# Patient Record
Sex: Female | Born: 1990
Health system: Southern US, Community
[De-identification: ages and names within clinical notes are randomized; demographics above are authoritative.]

## PROBLEM LIST (undated history)

## (undated) DIAGNOSIS — F419 Anxiety disorder, unspecified: Secondary | ICD-10-CM

## (undated) DIAGNOSIS — F4310 Post-traumatic stress disorder, unspecified: Secondary | ICD-10-CM

## (undated) DIAGNOSIS — F988 Other specified behavioral and emotional disorders with onset usually occurring in childhood and adolescence: Secondary | ICD-10-CM

## (undated) DIAGNOSIS — K529 Noninfective gastroenteritis and colitis, unspecified: Secondary | ICD-10-CM

## (undated) DIAGNOSIS — F32A Depression, unspecified: Secondary | ICD-10-CM

## (undated) DIAGNOSIS — I319 Disease of pericardium, unspecified: Secondary | ICD-10-CM

## (undated) DIAGNOSIS — F329 Major depressive disorder, single episode, unspecified: Secondary | ICD-10-CM

## (undated) DIAGNOSIS — J45909 Unspecified asthma, uncomplicated: Secondary | ICD-10-CM

## (undated) DIAGNOSIS — F319 Bipolar disorder, unspecified: Secondary | ICD-10-CM

## (undated) DIAGNOSIS — K859 Acute pancreatitis without necrosis or infection, unspecified: Secondary | ICD-10-CM

## (undated) DIAGNOSIS — Z1589 Genetic susceptibility to other disease: Secondary | ICD-10-CM

## (undated) DIAGNOSIS — I514 Myocarditis, unspecified: Secondary | ICD-10-CM

## (undated) HISTORY — DX: Post-traumatic stress disorder, unspecified: F43.10

## (undated) HISTORY — DX: Disease of pericardium, unspecified: I31.9

## (undated) HISTORY — DX: Bipolar disorder, unspecified: F31.9

## (undated) HISTORY — DX: Genetic susceptibility to other disease: Z15.89

## (undated) HISTORY — PX: INTRAUTERINE DEVICE INSERTION: SHX323

## (undated) HISTORY — DX: Unspecified asthma, uncomplicated: J45.909

## (undated) HISTORY — PX: WISDOM TOOTH EXTRACTION: SHX21

## (undated) HISTORY — PX: IUD REMOVAL: SHX5392

## (undated) HISTORY — DX: Other specified behavioral and emotional disorders with onset usually occurring in childhood and adolescence: F98.8

## (undated) HISTORY — DX: Myocarditis, unspecified: I51.4

---
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
## 2016-03-24 DIAGNOSIS — Z1589 Genetic susceptibility to other disease: Secondary | ICD-10-CM | POA: Insufficient documentation

## 2016-07-15 DIAGNOSIS — F4310 Post-traumatic stress disorder, unspecified: Secondary | ICD-10-CM | POA: Insufficient documentation

## 2016-08-05 DIAGNOSIS — F3170 Bipolar disorder, currently in remission, most recent episode unspecified: Secondary | ICD-10-CM | POA: Insufficient documentation

## 2019-05-12 ENCOUNTER — Encounter (HOSPITAL_COMMUNITY): Payer: Self-pay | Admitting: Emergency Medicine

## 2019-05-12 ENCOUNTER — Other Ambulatory Visit: Payer: Self-pay

## 2019-05-12 ENCOUNTER — Inpatient Hospital Stay (HOSPITAL_COMMUNITY)
Admission: EM | Admit: 2019-05-12 | Discharge: 2019-05-14 | DRG: 392 | Disposition: A | Discharge status: Home / Self Care | Payer: Self-pay | Attending: Student in an Organized Health Care Education/Training Program | Admitting: Student in an Organized Health Care Education/Training Program

## 2019-05-12 DIAGNOSIS — F17210 Nicotine dependence, cigarettes, uncomplicated: Secondary | ICD-10-CM | POA: Diagnosis present

## 2019-05-12 DIAGNOSIS — K920 Hematemesis: Secondary | ICD-10-CM

## 2019-05-12 DIAGNOSIS — K2970 Gastritis, unspecified, without bleeding: Secondary | ICD-10-CM | POA: Diagnosis present

## 2019-05-12 DIAGNOSIS — K2900 Acute gastritis without bleeding: Secondary | ICD-10-CM

## 2019-05-12 DIAGNOSIS — F329 Major depressive disorder, single episode, unspecified: Secondary | ICD-10-CM | POA: Diagnosis present

## 2019-05-12 DIAGNOSIS — E860 Dehydration: Secondary | ICD-10-CM | POA: Diagnosis present

## 2019-05-12 DIAGNOSIS — E872 Acidosis, unspecified: Secondary | ICD-10-CM | POA: Diagnosis present

## 2019-05-12 DIAGNOSIS — F1010 Alcohol abuse, uncomplicated: Secondary | ICD-10-CM | POA: Diagnosis present

## 2019-05-12 DIAGNOSIS — Z888 Allergy status to other drugs, medicaments and biological substances status: Secondary | ICD-10-CM

## 2019-05-12 DIAGNOSIS — Z23 Encounter for immunization: Secondary | ICD-10-CM

## 2019-05-12 DIAGNOSIS — F419 Anxiety disorder, unspecified: Secondary | ICD-10-CM | POA: Diagnosis present

## 2019-05-12 DIAGNOSIS — Z20828 Contact with and (suspected) exposure to other viral communicable diseases: Secondary | ICD-10-CM | POA: Diagnosis present

## 2019-05-12 DIAGNOSIS — Z803 Family history of malignant neoplasm of breast: Secondary | ICD-10-CM

## 2019-05-12 DIAGNOSIS — E8729 Other acidosis: Secondary | ICD-10-CM

## 2019-05-12 DIAGNOSIS — K2920 Alcoholic gastritis without bleeding: Principal | ICD-10-CM | POA: Diagnosis present

## 2019-05-12 DIAGNOSIS — Z882 Allergy status to sulfonamides status: Secondary | ICD-10-CM

## 2019-05-12 DIAGNOSIS — R109 Unspecified abdominal pain: Secondary | ICD-10-CM | POA: Diagnosis present

## 2019-05-12 DIAGNOSIS — Z880 Allergy status to penicillin: Secondary | ICD-10-CM

## 2019-05-12 DIAGNOSIS — R1084 Generalized abdominal pain: Secondary | ICD-10-CM

## 2019-05-12 DIAGNOSIS — K529 Noninfective gastroenteritis and colitis, unspecified: Secondary | ICD-10-CM

## 2019-05-12 HISTORY — DX: Anxiety disorder, unspecified: F41.9

## 2019-05-12 HISTORY — DX: Depression, unspecified: F32.A

## 2019-05-12 HISTORY — DX: Acute pancreatitis without necrosis or infection, unspecified: K85.90

## 2019-05-12 HISTORY — DX: Noninfective gastroenteritis and colitis, unspecified: K52.9

## 2019-05-12 LAB — COMPREHENSIVE METABOLIC PANEL
ALT: 26 U/L (ref 0–44)
AST: 36 U/L (ref 15–41)
Albumin: 4.4 g/dL (ref 3.5–5.0)
Alkaline Phosphatase: 50 U/L (ref 38–126)
Anion gap: 20 — ABNORMAL HIGH (ref 5–15)
BUN: 11 mg/dL (ref 6–20)
CO2: 15 mmol/L — ABNORMAL LOW (ref 22–32)
Calcium: 9.9 mg/dL (ref 8.9–10.3)
Chloride: 101 mmol/L (ref 98–111)
Creatinine, Ser: 0.91 mg/dL (ref 0.44–1.00)
GFR calc Af Amer: >60 mL/min (ref >60)
GFR calc non Af Amer: >60 mL/min (ref >60)
Glucose, Bld: 109 mg/dL — ABNORMAL HIGH (ref 70–99)
Potassium: 3.5 mmol/L (ref 3.5–5.1)
Sodium: 136 mmol/L (ref 135–145)
Total Bilirubin: 1.7 mg/dL — ABNORMAL HIGH (ref 0.3–1.2)
Total Protein: 7.2 g/dL (ref 6.5–8.1)

## 2019-05-12 LAB — CBC WITH DIFFERENTIAL/PLATELET
Abs Immature Granulocytes: 0.03 10*3/uL (ref 0.00–0.07)
Basophils Absolute: 0.1 10*3/uL (ref 0.0–0.1)
Basophils Relative: 1 %
Eosinophils Absolute: 0 10*3/uL (ref 0.0–0.5)
Eosinophils Relative: 0 %
HCT: 41.2 % (ref 36.0–46.0)
Hemoglobin: 15.1 g/dL — ABNORMAL HIGH (ref 12.0–15.0)
Immature Granulocytes: 0 %
Lymphocytes Relative: 17 %
Lymphs Abs: 2 10*3/uL (ref 0.7–4.0)
MCH: 35.2 pg — ABNORMAL HIGH (ref 26.0–34.0)
MCHC: 36.7 g/dL — ABNORMAL HIGH (ref 30.0–36.0)
MCV: 96 fL (ref 80.0–100.0)
Monocytes Absolute: 1 10*3/uL (ref 0.1–1.0)
Monocytes Relative: 9 %
Neutro Abs: 8.3 10*3/uL — ABNORMAL HIGH (ref 1.7–7.7)
Neutrophils Relative %: 73 %
Platelets: 299 10*3/uL (ref 150–400)
RBC: 4.29 MIL/uL (ref 3.87–5.11)
RDW: 10.9 % — ABNORMAL LOW (ref 11.5–15.5)
WBC: 11.4 10*3/uL — ABNORMAL HIGH (ref 4.0–10.5)
nRBC: 0 % (ref 0.0–0.2)

## 2019-05-12 LAB — LIPASE, BLOOD: Lipase: 22 U/L (ref 11–51)

## 2019-05-12 LAB — I-STAT BETA HCG BLOOD, ED (MC, WL, AP ONLY): I-stat hCG, quantitative: <5 m[IU]/mL (ref <5)

## 2019-05-12 MED ORDER — ONDANSETRON 4 MG PO TBDP
4.0000 mg | ORAL_TABLET | Freq: Once | ORAL | Status: AC
  Administered 2019-05-12: 4 mg via ORAL
  Filled 2019-05-12: qty 1

## 2019-05-12 NOTE — ED Triage Notes (Signed)
Patient reports pancreatitis flare up onset this week with persistent emesis and diarrhea , no fever or chills .

## 2019-05-12 NOTE — ED Triage Notes (Addendum)
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

## 2019-05-13 ENCOUNTER — Emergency Department (HOSPITAL_COMMUNITY): Payer: Self-pay

## 2019-05-13 ENCOUNTER — Encounter (HOSPITAL_COMMUNITY): Payer: Self-pay | Admitting: Radiology

## 2019-05-13 DIAGNOSIS — K2970 Gastritis, unspecified, without bleeding: Secondary | ICD-10-CM | POA: Diagnosis present

## 2019-05-13 DIAGNOSIS — Z8719 Personal history of other diseases of the digestive system: Secondary | ICD-10-CM

## 2019-05-13 DIAGNOSIS — K2920 Alcoholic gastritis without bleeding: Principal | ICD-10-CM

## 2019-05-13 DIAGNOSIS — E872 Acidosis, unspecified: Secondary | ICD-10-CM | POA: Diagnosis present

## 2019-05-13 DIAGNOSIS — E860 Dehydration: Secondary | ICD-10-CM | POA: Diagnosis present

## 2019-05-13 DIAGNOSIS — Z720 Tobacco use: Secondary | ICD-10-CM

## 2019-05-13 DIAGNOSIS — K529 Noninfective gastroenteritis and colitis, unspecified: Secondary | ICD-10-CM

## 2019-05-13 DIAGNOSIS — R109 Unspecified abdominal pain: Secondary | ICD-10-CM | POA: Diagnosis present

## 2019-05-13 DIAGNOSIS — Z7289 Other problems related to lifestyle: Secondary | ICD-10-CM

## 2019-05-13 DIAGNOSIS — F319 Bipolar disorder, unspecified: Secondary | ICD-10-CM

## 2019-05-13 LAB — COMPREHENSIVE METABOLIC PANEL
ALT: 22 U/L (ref 0–44)
AST: 27 U/L (ref 15–41)
Albumin: 3.6 g/dL (ref 3.5–5.0)
Alkaline Phosphatase: 42 U/L (ref 38–126)
Anion gap: 7 (ref 5–15)
BUN: 5 mg/dL — ABNORMAL LOW (ref 6–20)
CO2: 18 mmol/L — ABNORMAL LOW (ref 22–32)
Calcium: 8.1 mg/dL — ABNORMAL LOW (ref 8.9–10.3)
Chloride: 110 mmol/L (ref 98–111)
Creatinine, Ser: 0.71 mg/dL (ref 0.44–1.00)
GFR calc Af Amer: >60 mL/min (ref >60)
GFR calc non Af Amer: >60 mL/min (ref >60)
Glucose, Bld: 105 mg/dL — ABNORMAL HIGH (ref 70–99)
Potassium: 2.9 mmol/L — ABNORMAL LOW (ref 3.5–5.1)
Sodium: 135 mmol/L (ref 135–145)
Total Bilirubin: 1.2 mg/dL (ref 0.3–1.2)
Total Protein: 5.8 g/dL — ABNORMAL LOW (ref 6.5–8.1)

## 2019-05-13 LAB — URINALYSIS, ROUTINE W REFLEX MICROSCOPIC
Bilirubin Urine: NEGATIVE
Glucose, UA: NEGATIVE mg/dL
Ketones, ur: 80 mg/dL — AB
Leukocytes,Ua: NEGATIVE
Nitrite: NEGATIVE
Protein, ur: 30 mg/dL — AB
Specific Gravity, Urine: 1.023 (ref 1.005–1.030)
pH: 6 (ref 5.0–8.0)

## 2019-05-13 LAB — CBC
HCT: 35 % — ABNORMAL LOW (ref 36.0–46.0)
Hemoglobin: 12.2 g/dL (ref 12.0–15.0)
MCH: 35.1 pg — ABNORMAL HIGH (ref 26.0–34.0)
MCHC: 34.9 g/dL (ref 30.0–36.0)
MCV: 100.6 fL — ABNORMAL HIGH (ref 80.0–100.0)
Platelets: 210 10*3/uL (ref 150–400)
RBC: 3.48 MIL/uL — ABNORMAL LOW (ref 3.87–5.11)
RDW: 11.3 % — ABNORMAL LOW (ref 11.5–15.5)
WBC: 8.5 10*3/uL (ref 4.0–10.5)
nRBC: 0 % (ref 0.0–0.2)

## 2019-05-13 LAB — HIV ANTIBODY (ROUTINE TESTING W REFLEX): HIV Screen 4th Generation wRfx: NONREACTIVE

## 2019-05-13 LAB — TROPONIN I (HIGH SENSITIVITY): Troponin I (High Sensitivity): <2 ng/L (ref <18)

## 2019-05-13 LAB — ETHANOL: Alcohol, Ethyl (B): <10 mg/dL (ref <10)

## 2019-05-13 LAB — OCCULT BLOOD GASTRIC / DUODENUM (SPECIMEN CUP): Occult Blood, Gastric: NEGATIVE

## 2019-05-13 LAB — SARS CORONAVIRUS 2 (TAT 6-24 HRS): SARS Coronavirus 2: NEGATIVE

## 2019-05-13 LAB — LACTIC ACID, PLASMA: Lactic Acid, Venous: 1.4 mmol/L (ref 0.5–1.9)

## 2019-05-13 MED ORDER — ALUM & MAG HYDROXIDE-SIMETH 200-200-20 MG/5ML PO SUSP
30.0000 mL | Freq: Once | ORAL | Status: AC
  Administered 2019-05-13: 30 mL via ORAL
  Filled 2019-05-13: qty 30

## 2019-05-13 MED ORDER — HYDROMORPHONE HCL 1 MG/ML IJ SOLN
1.0000 mg | Freq: Once | INTRAMUSCULAR | Status: AC
  Administered 2019-05-13: 1 mg via INTRAVENOUS
  Filled 2019-05-13: qty 1

## 2019-05-13 MED ORDER — SODIUM CHLORIDE 0.9 % IV BOLUS (SEPSIS)
1000.0000 mL | Freq: Once | INTRAVENOUS | Status: AC
  Administered 2019-05-13: 1000 mL via INTRAVENOUS

## 2019-05-13 MED ORDER — LIDOCAINE VISCOUS HCL 2 % MT SOLN
15.0000 mL | Freq: Once | OROMUCOSAL | Status: AC
  Administered 2019-05-13: 15 mL via ORAL
  Filled 2019-05-13: qty 15

## 2019-05-13 MED ORDER — HYDROMORPHONE HCL 1 MG/ML IJ SOLN
0.5000 mg | Freq: Four times a day (QID) | INTRAMUSCULAR | Status: DC | PRN
  Administered 2019-05-13 – 2019-05-14 (×4): 1 mg via INTRAVENOUS
  Filled 2019-05-13 (×4): qty 1

## 2019-05-13 MED ORDER — FENTANYL CITRATE (PF) 100 MCG/2ML IJ SOLN
50.0000 ug | Freq: Once | INTRAMUSCULAR | Status: AC
  Administered 2019-05-13: 50 ug via INTRAVENOUS
  Filled 2019-05-13: qty 2

## 2019-05-13 MED ORDER — KETOROLAC TROMETHAMINE 30 MG/ML IJ SOLN
30.0000 mg | Freq: Four times a day (QID) | INTRAMUSCULAR | Status: DC | PRN
  Administered 2019-05-13 – 2019-05-14 (×4): 30 mg via INTRAVENOUS
  Filled 2019-05-13 (×4): qty 1

## 2019-05-13 MED ORDER — HYDROXYZINE HCL 25 MG PO TABS
25.0000 mg | ORAL_TABLET | Freq: Four times a day (QID) | ORAL | Status: DC | PRN
  Administered 2019-05-13: 25 mg via ORAL
  Filled 2019-05-13: qty 1

## 2019-05-13 MED ORDER — SODIUM CHLORIDE 0.9 % IV SOLN
INTRAVENOUS | Status: DC
  Administered 2019-05-13 – 2019-05-14 (×2): via INTRAVENOUS

## 2019-05-13 MED ORDER — SODIUM CHLORIDE 0.9 % IV SOLN: 8.0000 mg/h | INTRAVENOUS | Status: DC

## 2019-05-13 MED ORDER — METRONIDAZOLE IN NACL 5-0.79 MG/ML-% IV SOLN
500.0000 mg | Freq: Once | INTRAVENOUS | Status: AC
  Administered 2019-05-13: 500 mg via INTRAVENOUS
  Filled 2019-05-13: qty 100

## 2019-05-13 MED ORDER — PROMETHAZINE HCL 25 MG/ML IJ SOLN
25.0000 mg | INTRAMUSCULAR | Status: DC | PRN
  Administered 2019-05-13 (×4): 25 mg via INTRAVENOUS
  Filled 2019-05-13 (×4): qty 1

## 2019-05-13 MED ORDER — FENTANYL CITRATE (PF) 100 MCG/2ML IJ SOLN
50.0000 ug | INTRAMUSCULAR | Status: DC | PRN
  Administered 2019-05-13 (×4): 50 ug via INTRAVENOUS
  Filled 2019-05-13 (×4): qty 2

## 2019-05-13 MED ORDER — CIPROFLOXACIN IN D5W 400 MG/200ML IV SOLN
400.0000 mg | Freq: Once | INTRAVENOUS | Status: AC
  Administered 2019-05-13: 400 mg via INTRAVENOUS
  Filled 2019-05-13: qty 200

## 2019-05-13 MED ORDER — ONDANSETRON HCL 4 MG/2ML IJ SOLN
4.0000 mg | Freq: Once | INTRAMUSCULAR | Status: AC
  Administered 2019-05-13: 4 mg via INTRAVENOUS
  Filled 2019-05-13: qty 2

## 2019-05-13 MED ORDER — SODIUM CHLORIDE 0.9 % IV SOLN
80.0000 mg | Freq: Once | INTRAVENOUS | Status: AC
  Administered 2019-05-13: 80 mg via INTRAVENOUS
  Filled 2019-05-13: qty 80

## 2019-05-13 MED ORDER — IOHEXOL 350 MG/ML SOLN
80.0000 mL | Freq: Once | INTRAVENOUS | Status: AC | PRN
  Administered 2019-05-13: 80 mL via INTRAVENOUS

## 2019-05-13 MED ORDER — PANTOPRAZOLE SODIUM 40 MG IV SOLR
40.0000 mg | Freq: Two times a day (BID) | INTRAVENOUS | Status: DC
  Administered 2019-05-13 – 2019-05-14 (×3): 40 mg via INTRAVENOUS
  Filled 2019-05-13 (×3): qty 40

## 2019-05-13 MED ORDER — ENOXAPARIN SODIUM 40 MG/0.4ML ~~LOC~~ SOLN
40.0000 mg | SUBCUTANEOUS | Status: DC
  Filled 2019-05-13: qty 0.4

## 2019-05-13 NOTE — ED Provider Notes (Signed)
TIME SEEN: 12:37 AM  CHIEF COMPLAINT: Chest pain, abdominal pain, flank pain  HPI: Patient is a 28 year old female with history of alcohol induced pancreatitis, pericarditis 2 years ago while in [place] who presents to the emergency department with 1 day of severe epigastric pain, right flank pain and chest pain.  Described as sharp, severe.  Chest pain is worse with deep inspiration.  She states she feels short of breath.  States she has been visiting with family but is not aware of any Covid exposures.  States her last alcoholic beverage was 2 days ago.  States she drinks approximately fifth of bourbon every day.  She denies any salicylate use.  No fevers, cough.  Has had nausea and vomiting.  States she has been vomiting bright red blood today.  Describes it as streaks of blood.  No clots.  No hematochezia or melena.  No diarrhea.  Denies history of esophageal varices or previous endoscopy.  No previous abdominal surgery.  No history of PE or DVT, lower extremity swelling or pain but states "I would not be surprised" if she had a PE as she states she is overweight and a smoker.  Just moved here to [place].  Does not have a local PCP or gastroenterologist.  ROS: See HPI Constitutional: no fever  Eyes: no drainage  ENT: no runny nose   Cardiovascular:   chest pain  Resp:  SOB  GI:  vomiting GU: no dysuria Integumentary: no rash  Allergy: no hives  Musculoskeletal: no leg swelling  Neurological: no slurred speech ROS otherwise negative  PAST MEDICAL HISTORY/PAST SURGICAL HISTORY:  Past Medical History:  Diagnosis Date  . Pancreatitis     MEDICATIONS:  Prior to Admission medications   Not on File    ALLERGIES:  No Known Allergies  SOCIAL HISTORY:  Social History   Tobacco Use  . Smoking status: Never Smoker  . Smokeless tobacco: Never Used  Substance Use Topics  . Alcohol use: Never    Frequency: Never    FAMILY HISTORY: No family history on file.  EXAM: BP (!)  127/95 (BP Location: Left Arm)   Pulse 70   Temp 97.8 F (36.6 C) (Oral)   Resp 18   LMP 05/09/2019   SpO2 98%  CONSTITUTIONAL: Alert and oriented and responds appropriately to questions.  Appears uncomfortable but nontoxic-appearing HEAD: Normocephalic EYES: Conjunctivae clear, pupils appear equal, EOM appear intact ENT: normal nose; moist mucous membranes NECK: Supple, normal ROM CARD: RRR; S1 and S2 appreciated; no murmurs, no clicks, no rubs, no gallops RESP: Normal chest excursion without splinting, patient is tachypneic; breath sounds clear and equal bilaterally; no wheezes, no rhonchi, no rales, no hypoxia or respiratory distress, speaking full sentences ABD/GI: Normal bowel sounds; non-distended; soft, diffusely tender throughout the upper abdomen without guarding or rebound, no tenderness at McBurney's point BACK:  The back appears normal and is nontender to palpation EXT: Normal ROM in all joints; no deformity noted, no edema; no cyanosis, no calf tenderness or calf swelling SKIN: Normal color for age and race; warm; no rash on exposed skin NEURO: Moves all extremities equally PSYCH: Agitated.  Uncomfortable.  MEDICAL DECISION MAKING: Patient here with complaints of chest pain that is pleuritic in nature with shortness of breath as well as upper abdominal pain, right flank pain.  Differential includes PE, pericarditis, pneumonia, alcohol induced gastritis, cholecystitis, cholelithiasis, pancreatitis, pyelonephritis, UTI, kidney stone.  Patient appears uncomfortable here but nontoxic and afebrile.  She does have a metabolic  acidosis which I suspect is likely from heavy alcohol use.  She has history of alcohol induced pancreatitis.  Reports her last drink was within the past 24 hours.  Will hydrate patient.  We will add on ethanol level.  Will check lactate.  Will obtain CT of the chest as well as the abdomen pelvis for further evaluation.  EKG shows no ischemic abnormality.  ED  PROGRESS: Nurse reports she has had some emesis here with bright red blood.  No coffee grounds.  Will send gastric occult.  She is receiving IV Protonix.  Will give second antiemetic.   Patient reports she is still uncomfortable although seems in less pain currently.  CT scan shows no PE, pericardial effusion.  She does have mild wall thickening at the hepatic flexure with fat stranding changes due to mild colitis which could be infectious or inflammatory.  No history of inflammatory colitis previously.  Will cover with Cipro and Flagyl.  States she is not having any diarrhea but states "I have not had anything in me to have a bowel movement".  She is requesting admission and does not feel comfortable with discharge home at this time.  Her labs and urine are suggestive of dehydration.  We will continue IV hydration in the ED.  She has received 2 L IV fluid bolus.  Blood pressure now slightly soft after 2 rounds of IV Dilaudid.  Will provide further pain medication with IV fentanyl.  No longer vomiting in the ED after 2 rounds of Zofran.  Will discuss with medicine for admission.  3:20 AM Discussed patient's case with internal medicine resident, Dr. Koleen Distance.  I have recommended admission and patient (and family if present) agree with this plan. Admitting physician will place admission orders.   I reviewed all nursing notes, vitals, pertinent previous records and interpreted all EKGs, lab and urine results, imaging (as available).     EKG Interpretation  Date/Time:  Wednesday May 12 2019 23:54:20 EST Ventricular Rate:  68 PR Interval:  102 QRS Duration: 88 QT Interval:  450 QTC Calculation: 478 R Axis:   64 Text Interpretation: Sinus rhythm with short PR with Premature supraventricular complexes Otherwise normal ECG No old tracing to compare Confirmed by Yessica Putnam, Cyril Mourning [phone]) on 05/13/2019 12:06:14 AM       CRITICAL CARE Performed by: Cyril Mourning Paul Torpey   Total critical care time: 45  minutes  Critical care time was exclusive of separately billable procedures and treating other patients.  Critical care was necessary to treat or prevent imminent or life-threatening deterioration.  Critical care was time spent personally by me on the following activities: development of treatment plan with patient and/or surrogate as well as nursing, discussions with consultants, evaluation of patient's response to treatment, examination of patient, obtaining history from patient or surrogate, ordering and performing treatments and interventions, ordering and review of laboratory studies, ordering and review of radiographic studies, pulse oximetry and re-evaluation of patient's condition.   Marissa Castillo was evaluated in Emergency Department on 05/13/2019 for the symptoms described in the history of present illness. She was evaluated in the context of the global COVID-19 pandemic, which necessitated consideration that the patient might be at risk for infection with the SARS-CoV-2 virus that causes COVID-19. Institutional protocols and algorithms that pertain to the evaluation of patients at risk for COVID-19 are in a state of rapid change based on information released by regulatory bodies including the CDC and federal and state organizations. These policies and algorithms were followed during  the patient's care in the ED.  Patient was seen wearing N95, face shield, gloves.    Mari Battaglia, Delice Bison, DO 05/13/19 [phone]

## 2019-05-13 NOTE — ED Notes (Signed)
Patient transported to CT 

## 2019-05-13 NOTE — Progress Notes (Signed)
Received patientfrom ED, AOx4, VSS, pain at 2/10, ambulatory, oriented to room, bed controls, call light and plan of care.  NT gave ice water to drink, now lying on bed comfortably watching TV.  Will monitor.

## 2019-05-13 NOTE — ED Notes (Signed)
Diet ordered 

## 2019-05-13 NOTE — H&P (Signed)
Date: 05/13/2019               Patient Name:  Marissa Castillo MRN: [id number]  DOB: 03-18-1991 Age / Sex: 28 y.o., female   PCP: Patient, No Pcp Per         Medical Service: Internal Medicine Teaching Service         Attending Physician: Dr. Evette Doffing, Mallie Mussel, *    First Contact: Dr. Benjamine Mola Pager: [phone]  Second Contact: Dr. Myrtie Hawk Pager: [phone]       After Hours (After 5p/  First Contact Pager: [phone]  weekends / holidays): Second Contact Pager: [phone]   Chief Complaint: Vomiting   History of Present Illness:   Ms. Marissa Castillo is a 28 y/o female with a PMHx of bipolar disorder and pancreatitis who presents to the ED with c/o nausea, vomiting and abdominal pain.   Ms. Lograsso states that 2 days ago, she began to have severe nausea, right flank and diffuse abdominal pain that was constant. Pain is described as stabbing. The pain and nausea did not have any alleviating factors but is exacerbated by any movement. Yesterday around noon, she began to have intractable vomiting that was initially clear but developed bright red streaks after several bouts of vomiting. On the day that nausea and pain began, she notes that she did have several days of binge drinking, approximately 3x her normal daily amount of mostly whiskey, in addition to increased anxiety. Initially, she had suspected pancreatitis. Patient endorses chills, SOB, diarrhea intermittently, and generalized myalgias but denies fever, sore throat, cough, leg swelling, joint pain, melena, hematochezia.  [name] states these episodes have occurred in the past 2-3 years, approximately 5 times, but symptoms alleviated on their own after 2 days. Patient endorses chills and night sweats intermittently for the past 2-3 years, as well as decreased appetite. Denies any weight loss. No workup in the past for these gastric episodes. She is concerned for possible IBD.   Patient notes that she does not have insurance at this time and  would prefer GI be consulted during hospital admission.   ED Course:  CBC remarkable for leukocytosis of 11.4. CMP remarkable for AG of 20 and mildly elevated total bili of 1.7. CTA negative for PE. CT abdomen and pelvis showed mild colitis evident in the RUQ near hepatic flexure. Urinalysis positive for large hemoglobin and ketones. Patient received both Dilaudid and Zofran that did not alleviate her pain/nausea.   Meds:  Current Meds  Medication Sig  . buPROPion (WELLBUTRIN XL) 150 MG 24 hr tablet Take 150 mg by mouth daily.  . hydrOXYzine (ATARAX/VISTARIL) 50 MG tablet Take 50-100 mg by mouth at bedtime as needed (for sleep).  . naltrexone (DEPADE) 50 MG tablet Take 50 mg by mouth daily.   Allergies: Allergies as of 05/12/2019  . (No Known Allergies)   Past Medical History:  Diagnosis Date  . Pancreatitis    Family History:  Maternal Aunt: Hashimoto's hypothyroidism, breast cancer Mother: Breast cancer at age 8, deceased   Social History:  Lives with significant other Alcohol: Drinks daily but amounts are variable between 1 glass of wine to 1 bottle.  Drug: Marijuana, mostly recently today. No other drug use. Tobacco: 1 cigarette per day for the past 2-3 years   Review of Systems: A complete ROS was negative except as per HPI.   Physical Exam: Blood pressure 108/89, pulse 84, temperature 97.8 F (36.6 C), temperature source Oral, resp. rate (!) 21,  last menstrual period 05/09/2019, SpO2 100 %.  Physical Exam Vitals signs and nursing note reviewed.  Constitutional:      General: She is in acute distress (mild).     Appearance: She is obese.  Eyes:     Extraocular Movements: Extraocular movements intact.     Conjunctiva/sclera: Conjunctivae normal.  Cardiovascular:     Rate and Rhythm: Regular rhythm. Tachycardia present.     Heart sounds: No murmur.  Pulmonary:     Effort: Pulmonary effort is normal. No respiratory distress.     Breath sounds: Normal breath  sounds. No wheezing, rhonchi or rales.  Abdominal:     General: Bowel sounds are decreased.     Palpations: Abdomen is soft. There is no hepatomegaly.     Tenderness: There is abdominal tenderness in the right upper quadrant and epigastric area. There is right CVA tenderness and left CVA tenderness.  Musculoskeletal:        General: No tenderness or deformity.     Right lower leg: No edema.     Left lower leg: No edema.  Skin:    General: Skin is warm and dry.     Coloration: Skin is not jaundiced.  Neurological:     General: No focal deficit present.     Mental Status: She is alert and oriented to person, place, and time. Mental status is at baseline.  Psychiatric:        Mood and Affect: Mood is anxious.        Behavior: Behavior normal. Behavior is cooperative.    EKG: personally reviewed my interpretation is: Sinus rhythm with HR of 70, no ST elevation or depression  Assessment & Plan by Problem: Active Problems:   Colitis  [name] is a 28 y/o female with a PMHx of bipolar disorder and pancreatitis who presented to the ED with nausea, vomiting and abdominal pain and was found to have mild colitis on CT imaging.   # Acute Gastritis # Anion Gap Metabolic Acidosis  Symptoms most consistent with gastritis secondary to increased alcohol use in the past several days leading to intractable nausea, vomiting and diffuse abdominal pain. Lab work remarkable for anion gap metabolic acidosis, as well as ketones in urinalysis that both likely secondary to decreased PO intake. Leukocytosis likely due to severe vomiting.  - IVF with NS @ 125 cc/hr - Protonix 40 mg IV BID  - Fentanyl 50 mcg q2h PRN - Phenergan 25 mg IV q4h PRN - NPO  - CMP and CBC in the AM  # Mild Colitis:  On physical examination, patient did have significant pain in the right flank and RUQ region consistent with CT imaging finding of mild colitis. Differential includes infectious causes given patient's diarrhea in the  past few days. IBD lower on differential.   - Metronidazole 500 mg IV  - Ciprofloxacin 400 mg IV - Consider GI consultation if no improvement in symptoms  # Alcohol Use Disorder:  Patient did not recall AUD in her past medical history but notes she is taking Naltrexone. Will recommend outpatient follow up.   - CIWA protocol without ativan    Dispo: Admit patient to Observation with expected length of stay less than 2 midnights.  Signed: Dr. Jose Persia Internal Medicine PGY-1  Pager: [phone] 05/13/2019, 4:20 AM

## 2019-05-13 NOTE — ED Notes (Signed)
Patient is NPO at this time, No Diet was ordered for Lunch.

## 2019-05-13 NOTE — Progress Notes (Addendum)
  Subjective:  Patient seen at bedside. When evaluated in afternoon, patient reports worsening of nausea, abdominal pain. Patient is asking about GI consult. Counseled patient that this consult is likely not necessary at this point and patient could have outpatient GI followup. Patient expressed concern regarding financial issues and being lost to followup because of it.    Objective:   Vital Signs (last 24 hours): Vitals:   05/13/19 0400 05/13/19 0659 05/13/19 0700 05/13/19 0852  BP: 121/83 112/69  (!) 141/89  Pulse: 68  89 (!) 105  Resp:    20  Temp:      TempSrc:      SpO2: 100%  99% 100%   Physical Exam: General Alert and answers questions appropriately, no acute distress  Abdominal Soft, mild tenderness in RUQ, no distention. Bowel sounds present  Extremities No peripheral edema    Assessment/Plan:   Active Problems:   Colitis  Patient is a 28 year old female with past medical history of bipolar disorder and pancreatitis who presented to the ED with nausea, vomiting, and abdominal pain, clinical picture consistent with alcoholic gastritis.  # Acute gastritis: # High-risk Alcohol Use: # Anion Gap Metabolic acidosis: Symptoms consistent with gastritis secondary to increased alcohol use in the past several days.  Initial lab work was remarkable for anion gap metabolic acidosis as well as ketones in urinalysis that are both likely secondary to decreased p.o. intake. Mild wall thickening involving hepatic flexure noted on CT but may be secondary to inflammation in setting of enterogastritis. Pancreatitis ruled out with normal lipase and CT appearance of the pancreas. She did not do well with trial of liquid diet today, causing worsened pain. Will continue with symptomatic management and try advancing diet again tomorrow if symptoms improve * IVF with NS @ 125 cc/hr * Protonix 40 mg IV BID * Fentanyl 50 mcg Q2HR PRN * Phenergan 25 mg IV Q4HR PRN * Toradol 30 mg Q6HR PRN * NPO   Diet: NPO DVT Ppx: Lovenox 40 mg Q24HR Dispo: Anticipated discharge in approximately 1-2 days  Jeanmarie Hubert, MD 05/13/2019, 1:24 PM Pager: [phone]

## 2019-05-14 ENCOUNTER — Encounter (HOSPITAL_COMMUNITY): Payer: Self-pay | Admitting: General Practice

## 2019-05-14 DIAGNOSIS — Z880 Allergy status to penicillin: Secondary | ICD-10-CM

## 2019-05-14 DIAGNOSIS — Z888 Allergy status to other drugs, medicaments and biological substances status: Secondary | ICD-10-CM

## 2019-05-14 DIAGNOSIS — Z881 Allergy status to other antibiotic agents status: Secondary | ICD-10-CM

## 2019-05-14 DIAGNOSIS — R109 Unspecified abdominal pain: Secondary | ICD-10-CM

## 2019-05-14 DIAGNOSIS — K2900 Acute gastritis without bleeding: Secondary | ICD-10-CM

## 2019-05-14 DIAGNOSIS — E860 Dehydration: Secondary | ICD-10-CM

## 2019-05-14 LAB — MAGNESIUM: Magnesium: 1.9 mg/dL (ref 1.7–2.4)

## 2019-05-14 MED ORDER — POTASSIUM CHLORIDE CRYS ER 20 MEQ PO TBCR
20.0000 meq | EXTENDED_RELEASE_TABLET | Freq: Once | ORAL | Status: AC
  Administered 2019-05-14: 20 meq via ORAL
  Filled 2019-05-14: qty 1

## 2019-05-14 MED ORDER — INFLUENZA VAC SPLIT QUAD 0.5 ML IM SUSY
0.5000 mL | PREFILLED_SYRINGE | INTRAMUSCULAR | Status: AC
  Administered 2019-05-14: 0.5 mL via INTRAMUSCULAR
  Filled 2019-05-14: qty 0.5

## 2019-05-14 MED ORDER — POTASSIUM CHLORIDE CRYS ER 20 MEQ PO TBCR
40.0000 meq | EXTENDED_RELEASE_TABLET | Freq: Once | ORAL | Status: AC
  Administered 2019-05-14: 40 meq via ORAL
  Filled 2019-05-14: qty 2

## 2019-05-14 MED ORDER — PROMETHAZINE HCL 25 MG PO TABS: 25.0000 mg | ORAL_TABLET | Freq: Four times a day (QID) | ORAL | 0 refills | Status: AC | PRN

## 2019-05-14 MED ORDER — PANTOPRAZOLE SODIUM 40 MG PO TBEC: 40.0000 mg | DELAYED_RELEASE_TABLET | Freq: Every day | ORAL | 0 refills | Status: DC

## 2019-05-14 MED FILL — PROMETHAZINE 25 MG TABLET: 25 | 7 days supply | Qty: 28 | Fill #0

## 2019-05-14 MED FILL — PANTOPRAZOLE SOD DR 40 MG T: 40 | 60 days supply | Qty: 60 | Fill #0

## 2019-05-14 NOTE — Discharge Instructions (Signed)
You were seen for abdominal pain, nausea, and vomiting.  We have provided you with Protonix which we want you to take daily, and Phenergan which you can take as needed for nausea and vomiting.  We have sent a referral to [facility], and they will be calling you shortly to schedule appointment.  They said that the co-pay for an office visit is $184 without any financial assistance.  However, if you are able to get the [facility] health financial assistance prior to appointment there would not be this co-pay.  Thank you for allowing [place] to be part of your medical care!

## 2019-05-14 NOTE — Progress Notes (Addendum)
  Subjective:  Patient seen at bedside this morning.  Patient reports improvement in her nausea and vomiting.  Patient concerned about returning home given that she is reliant on IV pain medicine for pain control.  Objective:    Vital Signs (last 24 hours): Vitals:   05/13/19 0700 05/13/19 0852 05/13/19 2056 05/14/19 0352  BP:  (!) 141/89 117/76 114/70  Pulse: 89 (!) 105 78 (!) 59  Resp:  20 20 19   Temp:   98.6 F (37 C) 98.2 F (36.8 C)  TempSrc:      SpO2: 99% 100% 100% 99%    Physical Exam: General Alert and answers questions appropriately, no acute distress  Abdominal Soft, mild tenderness to palpation on right side. Bowel sounds present  Extremities No peripheral edema     Assessment/Plan:   Principal Problem:   Gastritis Active Problems:   Abdominal pain   Dehydration   High anion gap metabolic acidosis  Patient is a 28 year old female with past medical history of bipolar disorder and pancreatitis who presents to the ED with nausea, vomiting, and abdominal pain with clinical picture consistent with acute gastritis.  # Acute gastritis: # At-risk alcohol use: Symptoms consistent with self limited gastritis.  There was mild wall thickening involving the hepatic flexure noted on CT this may be secondary to inflammation in the setting of increased alcohol usage.  Patient is tolerating liquid diet but has continued abdominal pain requiring IV pain meds. The patient requests consultation with GI service as a second opinion.  She does have a chronic component of her abdominal pain, nausea/vomiting and changes in bowel habits, possibly consistent with early IBD.  Patient has concerns about being lost to follow-up given her financial restrictions.  We will consult gastroenterology and request for them to evaluate patient. * IVF with NS @ 125 cc/hr, K of 2.9 - repleting to 3.5 * Protonix 40 mg IV BID * Dilaudid 0.5-1.0 mg Q6HR PRN * Toradol 30 mg Q6HR PRN * Phenergan 25 mg IV  Q4HR PRN * Will advance from liquid to soft diet  Diet: Soft Diet DVT Ppx: Lovenox 40 mg Q24HR Dispo: Anticipated discharge in approximately 0-1 days  Jeanmarie Hubert, MD 05/14/2019, 6:05 AM Pager: [phone]

## 2019-05-14 NOTE — Consult Note (Signed)
[facility] Gastroenterology Consultation Note  Referring Provider: Internal Medicine Teaching Service Primary Care Physician:  Patient, No Pcp Per  Reason for Consultation:  Nausea and vomiting  HPI: Marissa Castillo is a 28 y.o. female admitted with nausea and vomiting.  Notes several year history of intermittent irritable stomach and nausea and vomiting.  However, these symptoms abruptly worsened over the past few days.  She reports several loose stools per day (but no blood) and some abdominal crampy discomfort.  No obvious weight loss or dysphagia.  No prior endoscopy or colonoscopy.  FHx significant for IBS, and precancerous polyps and patient reports personal history heterozygous for MUTYH.  CT showed some mild colonic inflammation of unclear etiology; for which she was started on antibiotics.   Past Medical History:  Diagnosis Date  . Anxiety   . Colitis   . Depression   . Pancreatitis     Past Surgical History:  Procedure Laterality Date  . WISDOM TOOTH EXTRACTION      Prior to Admission medications   Medication Sig Start Date End Date Taking? Authorizing Provider  buPROPion (WELLBUTRIN XL) 150 MG 24 hr tablet Take 150 mg by mouth daily. 04/27/19  Yes [provider]  hydrOXYzine (ATARAX/VISTARIL) 50 MG tablet Take 50-100 mg by mouth at bedtime as needed (for sleep).   Yes [provider]  naltrexone (DEPADE) 50 MG tablet Take 50 mg by mouth daily. 04/27/19  Yes [provider]    Current Facility-Administered Medications  Medication Dose Route Frequency Provider Last Rate Last Dose  . 0.9 %  sodium chloride infusion   Intravenous Continuous Ward, Kristen N, DO 125 mL/hr at 05/14/19 0600    . enoxaparin (LOVENOX) injection 40 mg  40 mg Subcutaneous Q24H Bloomfield, Carley D, DO      . HYDROmorphone (DILAUDID) injection 0.5-1 mg  0.5-1 mg Intravenous Q6H PRN Masoudi, Elhamalsadat, MD   1 mg at 05/14/19 0848  . hydrOXYzine (ATARAX/VISTARIL) tablet 25 mg  25  mg Oral Q6H PRN Jeanmarie Hubert, MD   25 mg at 05/13/19 1800  . [START ON 05/15/2019] influenza vac split quadrivalent PF (FLUARIX) injection 0.5 mL  0.5 mL Intramuscular Tomorrow-1000 Axel Filler, MD      . ketorolac (TORADOL) 30 MG/ML injection 30 mg  30 mg Intravenous Q6H PRN Masoudi, Elhamalsadat, MD   30 mg at 05/14/19 1418  . pantoprazole (PROTONIX) injection 40 mg  40 mg Intravenous Q12H Bloomfield, Carley D, DO   40 mg at 05/14/19 0947  . promethazine (PHENERGAN) injection 25 mg  25 mg Intravenous Q4H PRN Bloomfield, Carley D, DO   25 mg at 05/13/19 1805    Allergies as of 05/12/2019  . (No Known Allergies)    History reviewed. No pertinent family history.  Social History   Socioeconomic History  . Marital status: Single    Spouse name: Not on file  . Number of children: Not on file  . Years of education: Not on file  . Highest education level: Not on file  Occupational History  . Not on file  Social Needs  . Financial resource strain: Not on file  . Food insecurity    Worry: Not on file    Inability: Not on file  . Transportation needs    Medical: Not on file    Non-medical: Not on file  Tobacco Use  . Smoking status: Never Smoker  . Smokeless tobacco: Never Used  Substance and Sexual Activity  . Alcohol use: Yes  Frequency: Never    Comment: SOCIAL  . Drug use: Never  . Sexual activity: Not on file  Lifestyle  . Physical activity    Days per week: Not on file    Minutes per session: Not on file  . Stress: Not on file  Relationships  . Social Herbalist on phone: Not on file    Gets together: Not on file    Attends religious service: Not on file    Active member of club or organization: Not on file    Attends meetings of clubs or organizations: Not on file    Relationship status: Not on file  . Intimate partner violence    Fear of current or ex partner: Not on file    Emotionally abused: Not on file    Physically abused: Not on  file    Forced sexual activity: Not on file  Other Topics Concern  . Not on file  Social History Narrative  . Not on file    Review of Systems:  As per HPI, all others negative.  Physical Exam: Vital signs in last 24 hours: Temp:  [98.2 F (36.8 C)-98.6 F (37 C)] 98.2 F (36.8 C) (12/04 0352) Pulse Rate:  [59-78] 59 (12/04 0352) Resp:  [19-20] 19 (12/04 0352) BP: (114-117)/(70-76) 114/70 (12/04 0352) SpO2:  [99 %-100 %] 99 % (12/04 0352) Last BM Date: 05/11/19 General:   Alert, overweight, Well-developed, well-nourished, pleasant and cooperative in NAD Head:  Normocephalic and atraumatic. Eyes:  Sclera clear, no icterus.   Conjunctiva pink. Ears:  Normal auditory acuity. Nose:  No deformity, discharge,  or lesions. Mouth:  No deformity or lesions.  Oropharynx pink & moist. Neck:  Supple; no masses or thyromegaly. Abdomen:  Soft, protuberant, nontender and nondistended. No masses, hepatosplenomegaly or hernias noted. Normal bowel sounds, without guarding, and without rebound.     Msk:  Symmetrical without gross deformities. Normal posture. Pulses:  Normal pulses noted. Extremities:  Without clubbing or edema. Neurologic:  Alert and  oriented x4;  grossly normal neurologically. Skin:  Intact without significant lesions or rashes. Psych:  Alert and cooperative. Normal mood and affect.   Lab Results: Recent Labs    05/12/19 2206 05/13/19 2133  WBC 11.4* 8.5  HGB 15.1* 12.2  HCT 41.2 35.0*  PLT 299 210   BMET Recent Labs    05/12/19 2206 05/13/19 2133  NA 136 135  K 3.5 2.9*  CL 101 110  CO2 15* 18*  GLUCOSE 109* 105*  BUN 11 5*  CREATININE 0.91 0.71  CALCIUM 9.9 8.1*   LFT Recent Labs    05/13/19 2133  PROT 5.8*  ALBUMIN 3.6  AST 27  ALT 22  ALKPHOS 42  BILITOT 1.2   PT/INR No results for input(s): LABPROT, INR in the last 72 hours.  Studies/Results: Ct Angio Chest Pe W And/or Wo Contrast  Result Date: 05/13/2019 CLINICAL DATA:  Abdominal  pain, tachypnea, shortness of breath, history of pancreatitis EXAM: CT ANGIOGRAPHY CHEST WITH CONTRAST TECHNIQUE: Multidetector CT imaging of the chest was performed using the standard protocol during bolus administration of intravenous contrast. Multiplanar CT image reconstructions and MIPs were obtained to evaluate the vascular anatomy. CONTRAST:  62mL OMNIPAQUE IOHEXOL 350 MG/ML SOLN COMPARISON:  None. FINDINGS: Cardiovascular: There is a optimal opacification of the pulmonary arteries. There is no central,segmental, or subsegmental filling defects within the pulmonary arteries. The heart is normal in size. No pericardial effusion or thickening. No evidence right  heart strain. There is normal three-vessel brachiocephalic anatomy without proximal stenosis. The thoracic aorta is normal in appearance. Mediastinum/Nodes: No hilar, mediastinal, or axillary adenopathy. Thyroid gland, trachea, and esophagus demonstrate no significant findings. Lungs/Pleura: The lungs are clear. No pleural effusion or pneumothorax. No airspace consolidation. Upper Abdomen: No acute abnormalities present in the visualized portions of the upper abdomen. Musculoskeletal: No chest wall abnormality. No acute or significant osseous findings. Review of the MIP images confirms the above findings. Abdomen/pelvis: Lower chest: The visualized heart size within normal limits. No pericardial fluid/thickening. No hiatal hernia. The visualized portions of the lungs are clear. Hepatobiliary: The liver is normal in density without focal abnormality.The main portal vein is patent. No evidence of calcified gallstones, gallbladder wall thickening or biliary dilatation. Pancreas: Unremarkable. No pancreatic ductal dilatation or surrounding inflammatory changes. Spleen: Normal in size without focal abnormality. Adrenals/Urinary Tract: Both adrenal glands appear normal. The kidneys and collecting system appear normal without evidence of urinary tract calculus  or hydronephrosis. Bladder is unremarkable. Stomach/Bowel: The stomach and small bowel are normal in appearance. Within the right upper quadrant at the hepatic flexure there is question of mild wall thickening with edema and surrounding mild fat stranding changes. No pericolonic free fluid or free air is seen. A moderate amount of right colonic stool is seen. Vascular/Lymphatic: There are no enlarged mesenteric, retroperitoneal, or pelvic lymph nodes. No significant vascular findings are present. Reproductive: The uterus and adnexa are unremarkable. Other: No evidence of abdominal wall mass or hernia. Musculoskeletal: No acute or significant osseous findings. IMPRESSION: 1.  No central, segmental, or subsegmental pulmonary embolism. 2. No acute intrathoracic pathology. 3. mild wall thickening involving the hepatic flexure with minimal surrounding fat stranding changes which could be due to mild colitis, infectious or inflammatory. Electronically Signed   By: Prudencio Pair M.D.   On: 05/13/2019 02:41   Ct Abdomen Pelvis W Contrast  Result Date: 05/13/2019 CLINICAL DATA:  Abdominal pain, tachypnea, shortness of breath, history of pancreatitis EXAM: CT ANGIOGRAPHY CHEST WITH CONTRAST TECHNIQUE: Multidetector CT imaging of the chest was performed using the standard protocol during bolus administration of intravenous contrast. Multiplanar CT image reconstructions and MIPs were obtained to evaluate the vascular anatomy. CONTRAST:  55mL OMNIPAQUE IOHEXOL 350 MG/ML SOLN COMPARISON:  None. FINDINGS: Cardiovascular: There is a optimal opacification of the pulmonary arteries. There is no central,segmental, or subsegmental filling defects within the pulmonary arteries. The heart is normal in size. No pericardial effusion or thickening. No evidence right heart strain. There is normal three-vessel brachiocephalic anatomy without proximal stenosis. The thoracic aorta is normal in appearance. Mediastinum/Nodes: No hilar,  mediastinal, or axillary adenopathy. Thyroid gland, trachea, and esophagus demonstrate no significant findings. Lungs/Pleura: The lungs are clear. No pleural effusion or pneumothorax. No airspace consolidation. Upper Abdomen: No acute abnormalities present in the visualized portions of the upper abdomen. Musculoskeletal: No chest wall abnormality. No acute or significant osseous findings. Review of the MIP images confirms the above findings. Abdomen/pelvis: Lower chest: The visualized heart size within normal limits. No pericardial fluid/thickening. No hiatal hernia. The visualized portions of the lungs are clear. Hepatobiliary: The liver is normal in density without focal abnormality.The main portal vein is patent. No evidence of calcified gallstones, gallbladder wall thickening or biliary dilatation. Pancreas: Unremarkable. No pancreatic ductal dilatation or surrounding inflammatory changes. Spleen: Normal in size without focal abnormality. Adrenals/Urinary Tract: Both adrenal glands appear normal. The kidneys and collecting system appear normal without evidence of urinary tract calculus or hydronephrosis. Bladder  is unremarkable. Stomach/Bowel: The stomach and small bowel are normal in appearance. Within the right upper quadrant at the hepatic flexure there is question of mild wall thickening with edema and surrounding mild fat stranding changes. No pericolonic free fluid or free air is seen. A moderate amount of right colonic stool is seen. Vascular/Lymphatic: There are no enlarged mesenteric, retroperitoneal, or pelvic lymph nodes. No significant vascular findings are present. Reproductive: The uterus and adnexa are unremarkable. Other: No evidence of abdominal wall mass or hernia. Musculoskeletal: No acute or significant osseous findings. IMPRESSION: 1.  No central, segmental, or subsegmental pulmonary embolism. 2. No acute intrathoracic pathology. 3. mild wall thickening involving the hepatic flexure with  minimal surrounding fat stranding changes which could be due to mild colitis, infectious or inflammatory. Electronically Signed   By: Prudencio Pair M.D.   On: 05/13/2019 02:41    Impression:  1.  Nausea and vomiting. 2.  Abdominal discomfort. 3.  Irregular bowel habits. 4.  Family history precancerous polyps (including brother, who is younger than patient). 5.  MUTYH heterozygote per patient report.  Plan:  1.  I think endoscopy and colonoscopy are both reasonable. 2.  I think we can schedule these in expedited fashion as outpatient; patient is ok with this approach, but if she changes her mind and wants procedures done as inpatient I'm happy to do that as well. 3.  Treat nausea/vomiting with prn antiemetics as well as daily PPI. 4.  If diarrhea recurs/persists, we can consider stool studies. 5.  Advance diet as tolerated.   6.  [facility] will sign-off; we will contact patient to arrange outpatient endoscopy and colonoscopy procedures.   LOS: 1 day   Hattie Pine M  05/14/2019, 2:29 PM  Cell [phone] If no answer or after 5 PM call [phone]

## 2019-05-14 NOTE — Progress Notes (Signed)
Patient discharged to home. Verbalized understanding of all discharge instructions including discharge medications and follow up MD visits. Patient GI referral as outpatient. Patient left unit with significant other independently at 1708.

## 2019-05-14 NOTE — TOC Initial Note (Signed)
Transition of Care [facility]) - Initial/Assessment Note    Patient Details  Name: Yanissa Mabile MRN: [id number] Date of Birth: 1990-07-28  Transition of Care [facility]) CM/SW Contact:    Bartholomew Crews, RN Phone Number: [phone] 05/14/2019, 10:48 AM  Clinical Narrative:                 NCM acknowledging consult for PCP. Hospital f/u appointment scheduled for first available appointment at [facility] - 08/03/2019 9:20am.   Advised that patient would benefit from [facility]) pharmacy. [facility] is not open on Saturdays and Sundays - if anticipated discharge is over weekend, please send prescriptions on Friday to [facility], and medications can be stored in main pharmacy until patient ready to discharge.   TOC team following for transition needs.   Expected Discharge Plan: Home/Self Care Barriers to Discharge: Continued Medical Work up   Patient Goals and CMS Choice     Choice offered to / list presented to : NA  Expected Discharge Plan and Services Expected Discharge Plan: Home/Self Care   Discharge Planning Services: CM Consult, [facility] Post Acute Care Choice: NA                   DME Arranged: N/A DME Agency: NA       HH Arranged: NA HH Agency: NA        Prior Living Arrangements/Services                       Activities of Daily Living Home Assistive Devices/Equipment: None ADL Screening (condition at time of admission) Patient's cognitive ability adequate to safely complete daily activities?: Yes Is the patient deaf or have difficulty hearing?: No Does the patient have difficulty seeing, even when wearing glasses/contacts?: No Does the patient have difficulty concentrating, remembering, or making decisions?: No Patient able to express need for assistance with ADLs?: Yes Does the patient have difficulty dressing or bathing?: No Independently performs ADLs?: Yes (appropriate for developmental age) Does the  patient have difficulty walking or climbing stairs?: No Weakness of Legs: None Weakness of Arms/Hands: None  Permission Sought/Granted                  Emotional Assessment              Admission diagnosis:  Dehydration [E86.0] Alcohol abuse [F10.10] Colitis [K52.9] Increased anion gap metabolic acidosis 99991111 Hematemesis with nausea [K92.0] Patient Active Problem List   Diagnosis Date Noted  . Gastritis 05/13/2019  . Abdominal pain 05/13/2019  . Dehydration 05/13/2019  . High anion gap metabolic acidosis 99991111  . Bipolar disorder in remission ([place]) 08/05/2016  . PTSD (post-traumatic stress disorder) 07/15/2016  . Monoallelic mutation of MUTYH gene 03/24/2016   PCP:  Patient, No Pcp Per Pharmacy:   [facility] [address] [place] - [place] [address] [place] [place] [address] Phone: [phone] Fax: [place], [place] [address] [place] [address] Phone: [phone] Fax: [phone]     Social Determinants of Health (SDOH) Interventions    Readmission Risk Interventions No flowsheet data found.

## 2019-05-14 NOTE — TOC Progression Note (Addendum)
Transition of Care [facility]) - Progression Note    Patient Details  Name: Kishara Loveland MRN: [id number] Date of Birth: 1990-11-18  Transition of Care [facility]) CM/SW Contact  Bartholomew Crews, RN Phone Number: [phone] 05/14/2019, 3:21 PM  Clinical Narrative:    Contacted by MD about patient needing further GI follow up - inpt vs outpt. NCM not an expert in this area. Email sent to financial counselor to request assist.   MD advised that patient may dc over weekend. Advised that patient cannot use TOC over weekend. MD sent prescriptions to [facility] today. TOC reached out to patient for payment - patient able to pay without assistance. Husband in the room when prescriptions delivered, and will take meds home.   TOC team following for transition needs.    Expected Discharge Plan: Home/Self Care Barriers to Discharge: Continued Medical Work up  Expected Discharge Plan and Services Expected Discharge Plan: Home/Self Care   Discharge Planning Services: CM Consult, [facility] Post Acute Care Choice: NA                   DME Arranged: N/A DME Agency: NA       HH Arranged: NA HH Agency: NA         Social Determinants of Health (SDOH) Interventions    Readmission Risk Interventions No flowsheet data found.

## 2019-05-15 NOTE — Discharge Summary (Addendum)
Name: Marissa Castillo MRN: [id number] DOB: August 03, 1990 28 y.o. PCP: Patient, No Pcp Per  Date of Admission: 05/12/2019  9:44 PM Date of Discharge: 05/14/2019 Attending Physician: Lalla Brothers, MD  Discharge Diagnosis: 1. Gastritis 2. Abdominal pain 3. Dehydration  Discharge Medications: Allergies as of 05/14/2019       Reactions   Ondansetron Swelling   Swelling (site not acknowledged)   Penicillins Diarrhea   Did it involve swelling of the face/tongue/throat, SOB, or low BP? No Did it involve sudden or severe rash/hives, skin peeling, or any reaction on the inside of your mouth or nose? No Did you need to seek medical attention at a hospital or doctor's office? No When did it last happen? Within the past 10 years If all above answers are "NO", may proceed with cephalosporin use.   Sulfa Antibiotics Rash        Medication List     TAKE these medications    buPROPion 150 MG 24 hr tablet Commonly known as: WELLBUTRIN XL Take 150 mg by mouth daily.   hydrOXYzine 50 MG tablet Commonly known as: ATARAX/VISTARIL Take 50-100 mg by mouth at bedtime as needed (for sleep).   naltrexone 50 MG tablet Commonly known as: DEPADE Take 50 mg by mouth daily.   pantoprazole 40 MG tablet Commonly known as: Protonix Take 1 tablet (40 mg total) by mouth daily.   promethazine 25 MG tablet Commonly known as: PHENERGAN Take 1 tablet (25 mg total) by mouth every 6 (six) hours as needed for nausea or vomiting.        Disposition and follow-up:   Ms.Marissa Castillo was discharged from [facility] in Good condition.  At the hospital follow up visit please address:  1.  Patient has chronic abdominal pain, nausea, vomiting and needs outpatient evaluation by GI.  2.  Labs / imaging needed at time of follow-up: None  3.  Pending labs/ test needing follow-up: None  Follow-up Appointments:    Hospital Course by problem list:  # Gastritis: # At-risk alcohol use: #  Anion Gap Metabolic Acidosis: Patient presented with 2 days of nausea, vomiting, right flank pain with history of similar episodes occurring multiple times over the past few years.  Patient did report increased alcohol usage over the few days prior to presentation.  Initial lab work demonstrated anion gap metabolic acidosis as well as ketones on urinalysis.  Patient was treated symptomatically with IV fluids, Protonix, Dilaudid, Phenergan, and Toradol.  Patient's anion gap returned to normal limits with rehydration, and patient had gradual improvement in symptoms.  Given chronic nature of patient's symptoms, there was concern for IBD.  Patient was evaluated by [facility] gastroenterology who recommended inpatient versus outpatient endoscopy and colonoscopy.  After speaking with financial counselor, patient requested outpatient referral for [facility] as they bill under [place] and she is planning on applying for the [place] financial assistance program.  Referral to [facility] was placed and patient discharged.  Discharge Vitals:   BP 112/77 (BP Location: Right Arm)   Pulse 78   Temp 98.1 F (36.7 C) (Oral)   Resp 18   LMP 05/09/2019   SpO2 100%   Pertinent Labs, Studies, and Procedures:  CBC Latest Ref Rng & Units 05/13/2019 05/12/2019  WBC 4.0 - 10.5 K/uL 8.5 11.4(H)  Hemoglobin 12.0 - 15.0 g/dL 12.2 15.1(H)  Hematocrit 36.0 - 46.0 % 35.0(L) 41.2  Platelets 150 - 400 K/uL 210 299   BMP Latest Ref Rng &  Units 05/13/2019 05/12/2019  Glucose 70 - 99 mg/dL 105(H) 109(H)  BUN 6 - 20 mg/dL 5(L) 11  Creatinine 0.44 - 1.00 mg/dL 0.71 0.91  Sodium 135 - 145 mmol/L 135 136  Potassium 3.5 - 5.1 mmol/L 2.9(L) 3.5  Chloride 98 - 111 mmol/L 110 101  CO2 22 - 32 mmol/L 18(L) 15(L)  Calcium 8.9 - 10.3 mg/dL 8.1(L) 9.9   CT Abdomen/Pelvis W Contrast (05/13/19): CT Angio Chest PE W or WO Contrast (05/13/2019): IMPRESSION: 1.  No central, segmental, or subsegmental pulmonary embolism. 2. No acute  intrathoracic pathology. 3. mild wall thickening involving the hepatic flexure with minimal surrounding fat stranding changes which could be due to mild colitis, infectious or inflammatory.   Discharge Instructions: Discharge Instructions     Ambulatory referral to Gastroenterology   Complete by: As directed    Patient seen by [facility] and planned for endoscopy/colonoscopy for possible colitis as outpatient. Patient requested [facility] referral after speaking with financial counselor - patient uninsured.   Call MD for:  persistant nausea and vomiting   Complete by: As directed    Call MD for:  severe uncontrolled pain   Complete by: As directed    Call MD for:  temperature >100.4   Complete by: As directed    Diet - low sodium heart healthy   Complete by: As directed    Increase activity slowly   Complete by: As directed        Signed: Jeanmarie Hubert, MD 05/15/2019, 12:22 PM   Pager: [phone]

## 2019-05-17 ENCOUNTER — Telehealth: Payer: Self-pay | Admitting: Internal Medicine

## 2019-05-17 NOTE — Telephone Encounter (Signed)
Patient is a DO and requested to schedule with [facility] because of [facility]'s financial assistance.  She stated that she understands the formality but asked Dr. Henrene Pastor to reconsider.  She will be speaking to Dr. Paulita Fujita about transfer of care.

## 2019-05-17 NOTE — Telephone Encounter (Signed)
Dr. Henrene Pastor, there is a referral to discuss an EGD/colon to evaluate colitis.  Patient was seen by Dr. Paulita Fujita in the hospital 12.4.20 (notes can be viewed in Epic.)    Patient will be self pay and requested you as a GI MD because of high recommendation.    Please review notes and advise scheduling.

## 2019-05-17 NOTE — Telephone Encounter (Signed)
I reviewed the hospital notes.  She has just seen [facility] a few days ago-who was planning outpatient follow-up.  I would recommend that she continue with them at this point.  Thanks

## 2019-07-12 DIAGNOSIS — R1011 Right upper quadrant pain: Secondary | ICD-10-CM

## 2019-07-12 DIAGNOSIS — G8929 Other chronic pain: Secondary | ICD-10-CM

## 2019-07-12 DIAGNOSIS — K2970 Gastritis, unspecified, without bleeding: Secondary | ICD-10-CM

## 2019-07-12 HISTORY — DX: Other chronic pain: G89.29

## 2019-07-12 HISTORY — DX: Gastritis, unspecified, without bleeding: K29.70

## 2019-07-12 HISTORY — DX: Right upper quadrant pain: R10.11

## 2019-08-03 ENCOUNTER — Ambulatory Visit: Payer: Self-pay | Admitting: Family Medicine

## 2019-08-05 ENCOUNTER — Telehealth: Payer: Self-pay | Admitting: Family Medicine

## 2019-08-05 NOTE — Telephone Encounter (Signed)
Pt called and reminded of appointment 

## 2019-08-06 ENCOUNTER — Ambulatory Visit (INDEPENDENT_AMBULATORY_CARE_PROVIDER_SITE_OTHER): Payer: Medicaid - Out of State | Admitting: Family Medicine

## 2019-08-06 ENCOUNTER — Other Ambulatory Visit: Payer: Self-pay

## 2019-08-06 ENCOUNTER — Encounter: Payer: Self-pay | Admitting: Family Medicine

## 2019-08-06 VITALS — BP 124/80 | HR 85 | Ht 66.0 in | Wt 184.2 lb

## 2019-08-06 DIAGNOSIS — Z3200 Encounter for pregnancy test, result unknown: Secondary | ICD-10-CM

## 2019-08-06 DIAGNOSIS — Z7689 Persons encountering health services in other specified circumstances: Secondary | ICD-10-CM | POA: Diagnosis not present

## 2019-08-06 DIAGNOSIS — Z8719 Personal history of other diseases of the digestive system: Secondary | ICD-10-CM | POA: Diagnosis not present

## 2019-08-06 DIAGNOSIS — E876 Hypokalemia: Secondary | ICD-10-CM

## 2019-08-06 DIAGNOSIS — F410 Panic disorder [episodic paroxysmal anxiety] without agoraphobia: Secondary | ICD-10-CM

## 2019-08-06 DIAGNOSIS — Z09 Encounter for follow-up examination after completed treatment for conditions other than malignant neoplasm: Secondary | ICD-10-CM

## 2019-08-06 DIAGNOSIS — Z0000 Encounter for general adult medical examination without abnormal findings: Secondary | ICD-10-CM

## 2019-08-06 DIAGNOSIS — R1084 Generalized abdominal pain: Secondary | ICD-10-CM

## 2019-08-06 DIAGNOSIS — F419 Anxiety disorder, unspecified: Secondary | ICD-10-CM

## 2019-08-06 DIAGNOSIS — R739 Hyperglycemia, unspecified: Secondary | ICD-10-CM

## 2019-08-06 LAB — POCT URINALYSIS DIPSTICK
Bilirubin, UA: NEGATIVE
Blood, UA: NEGATIVE
Glucose, UA: NEGATIVE
Ketones, UA: 160
Leukocytes, UA: NEGATIVE
Nitrite, UA: NEGATIVE
Protein, UA: POSITIVE — AB
Spec Grav, UA: >=1.03 — AB (ref 1.010–1.025)
Urobilinogen, UA: 0.2 E.U./dL
pH, UA: 5.5 (ref 5.0–8.0)

## 2019-08-06 LAB — GLUCOSE, POCT (MANUAL RESULT ENTRY): POC Glucose: 79 mg/dl (ref 70–99)

## 2019-08-06 LAB — POCT GLYCOSYLATED HEMOGLOBIN (HGB A1C): Hemoglobin A1C: 4.5 % (ref 4.0–5.6)

## 2019-08-06 LAB — POCT URINE PREGNANCY: Preg Test, Ur: NEGATIVE

## 2019-08-06 MED ORDER — ALPRAZOLAM 0.5 MG PO TABS: 0.5000 mg | ORAL_TABLET | Freq: Every evening | ORAL | 0 refills | Status: DC | PRN

## 2019-08-06 MED ORDER — PANTOPRAZOLE SODIUM 40 MG PO TBEC: 40.0000 mg | DELAYED_RELEASE_TABLET | Freq: Two times a day (BID) | ORAL | 6 refills | Status: AC

## 2019-08-06 NOTE — Patient Instructions (Signed)
Alprazolam tablets What is this medicine? ALPRAZOLAM (al PRAY zoe lam) is a benzodiazepine. It is used to treat anxiety and panic attacks. This medicine may be used for other purposes; ask your health care provider or pharmacist if you have questions. COMMON BRAND NAME(S): Xanax What should I tell my health care provider before I take this medicine? They need to know if you have any of these conditions:  an alcohol or drug abuse problem  bipolar disorder, depression, psychosis or other mental health conditions  glaucoma  kidney or liver disease  lung or breathing disease  myasthenia gravis  Parkinson's disease  porphyria  seizures or a history of seizures  suicidal thoughts  an unusual or allergic reaction to alprazolam, other benzodiazepines, foods, dyes, or preservatives  pregnant or trying to get pregnant  breast-feeding How should I use this medicine? Take this medicine by mouth with a glass of water. Follow the directions on the prescription label. Take your medicine at regular intervals. Do not take it more often than directed. Do not stop taking except on your doctor's advice. A special MedGuide will be given to you by the pharmacist with each prescription and refill. Be sure to read this information carefully each time. Talk to your pediatrician regarding the use of this medicine in children. Special care may be needed. Overdosage: If you think you have taken too much of this medicine contact a poison control center or emergency room at once. NOTE: This medicine is only for you. Do not share this medicine with others. What if I miss a dose? If you miss a dose, take it as soon as you can. If it is almost time for your next dose, take only that dose. Do not take double or extra doses. What may interact with this medicine? Do not take this medicine with any of the following medications:  certain antiviral medicines for HIV or AIDS like delavirdine,  indinavir  certain medicines for fungal infections like ketoconazole and itraconazole  narcotic medicines for cough  sodium oxybate This medicine may also interact with the following medications:  alcohol  antihistamines for allergy, cough and cold  certain antibiotics like clarithromycin, erythromycin, isoniazid, rifampin, rifapentine, rifabutin, and troleandomycin  certain medicines for blood pressure, heart disease, irregular heart beat  certain medicines for depression, like amitriptyline, fluoxetine, sertraline  certain medicines for seizures like carbamazepine, oxcarbazepine, phenobarbital, phenytoin, primidone  cimetidine  cyclosporine  female hormones, like estrogens or progestins and birth control pills, patches, rings, or injections  general anesthetics like halothane, isoflurane, methoxyflurane, propofol  grapefruit juice  local anesthetics like lidocaine, pramoxine, tetracaine  medicines that relax muscles for surgery  narcotic medicines for pain  other antiviral medicines for HIV or AIDS  phenothiazines like chlorpromazine, mesoridazine, prochlorperazine, thioridazine This list may not describe all possible interactions. Give your health care provider a list of all the medicines, herbs, non-prescription drugs, or dietary supplements you use. Also tell them if you smoke, drink alcohol, or use illegal drugs. Some items may interact with your medicine. What should I watch for while using this medicine? Tell your doctor or health care professional if your symptoms do not start to get better or if they get worse. Do not stop taking except on your doctor's advice. You may develop a severe reaction. Your doctor will tell you how much medicine to take. You may get drowsy or dizzy. Do not drive, use machinery, or do anything that needs mental alertness until you know how this medicine affects  you. To reduce the risk of dizzy and fainting spells, do not stand or sit up  quickly, especially if you are an older patient. Alcohol may increase dizziness and drowsiness. Avoid alcoholic drinks. If you are taking another medicine that also causes drowsiness, you may have more side effects. Give your health care provider a list of all medicines you use. Your doctor will tell you how much medicine to take. Do not take more medicine than directed. Call emergency for help if you have problems breathing or unusual sleepiness. What side effects may I notice from receiving this medicine? Side effects that you should report to your doctor or health care professional as soon as possible:  allergic reactions like skin rash, itching or hives, swelling of the face, lips, or tongue  breathing problems  confusion  loss of balance or coordination  signs and symptoms of low blood pressure like dizziness; feeling faint or lightheaded, falls; unusually weak or tired  suicidal thoughts or other mood changes Side effects that usually do not require medical attention (report to your doctor or health care professional if they continue or are bothersome):  dizziness  dry mouth  nausea, vomiting  tiredness This list may not describe all possible side effects. Call your doctor for medical advice about side effects. You may report side effects to FDA at 1-800-FDA-1088. Where should I keep my medicine? Keep out of the reach of children. This medicine can be abused. Keep your medicine in a safe place to protect it from theft. Do not share this medicine with anyone. Selling or giving away this medicine is dangerous and against the law. Store at room temperature between 20 and 25 degrees C (68 and 77 degrees F). This medicine may cause accidental overdose and death if taken by other adults, children, or pets. Mix any unused medicine with a substance like cat litter or coffee grounds. Then throw the medicine away in a sealed container like a sealed bag or a coffee can with a lid. Do not use  the medicine after the expiration date. NOTE: This sheet is a summary. It may not cover all possible information. If you have questions about this medicine, talk to your doctor, pharmacist, or health care provider.  2020 Elsevier/Gold Standard (2015-02-23 13:47:25)  

## 2019-08-06 NOTE — Progress Notes (Signed)
Patient Marissa Castillo   New Patient--Hospital Follow Up--Establish Castillo  Subjective:  Patient ID: Marissa Castillo, female    DOB: 04/03/1991  Age: 29 y.o. MRN: [id number]  CC:  Chief Complaint  Patient presents with  . New Patient (Initial Visit)  . Hospitalization Follow-up    05/11/2020 ED Colitis    HPI [name] is a 29 year old female who presents for Hospital Follow Up and Establish Castillo today.   Past Medical History:  Diagnosis Date  . Anxiety   . Chronic right upper quadrant pain 07/2019  . Colitis   . Depression   . Gastritis 07/2019  . Pancreatitis    Current Status: This will be her initial office visit with me. She was not previously regularly seeing a physician for her PCP needs. Since her last office visit, she continues to have c/o abdominal discomfort. Occasional heart palpitations. She is currently under Castillo of Psychiatry. Her anxiety is her today. She denies suicidal ideations, homicidal ideations, or auditory hallucinations. She denies any other GI problems such as nausea, vomiting, diarrhea, and constipation.  She has no reports of blood in stools, dysuria and hematuria. She denies fevers, chills, fatigue, recent infections, weight loss, and night sweats. She has not had any headaches, visual changes, dizziness, and falls. No chest pain, heart palpitations, cough and shortness of breath reported. She denies pain today.   Past Surgical History:  Procedure Laterality Date  . WISDOM TOOTH EXTRACTION      Family History  Problem Relation Age of Onset  . Breast cancer Mother     Social History   Socioeconomic History  . Marital status: Single    Spouse name: Not on file  . Number of children: Not on file  . Years of education: Not on file  . Highest education level: Not on file  Occupational History  . Not on file  Tobacco Use  . Smoking status: Never Smoker  . Smokeless tobacco: Never Used  Substance and  Sexual Activity  . Alcohol use: Yes    Comment: SOCIAL  . Drug use: Never  . Sexual activity: Yes    Birth control/protection: Condom  Other Topics Concern  . Not on file  Social History Narrative  . Not on file   Social Determinants of Health   Financial Resource Strain:   . Difficulty of Paying Living Expenses: Not on file  Food Insecurity:   . Worried About Charity fundraiser in the Last Year: Not on file  . Ran Out of Food in the Last Year: Not on file  Transportation Needs:   . Lack of Transportation (Medical): Not on file  . Lack of Transportation (Non-Medical): Not on file  Physical Activity:   . Days of Exercise per Week: Not on file  . Minutes of Exercise per Session: Not on file  Stress:   . Feeling of Stress : Not on file  Social Connections:   . Frequency of Communication with Friends and Family: Not on file  . Frequency of Social Gatherings with Friends and Family: Not on file  . Attends Religious Services: Not on file  . Active Member of Clubs or Organizations: Not on file  . Attends Archivist Meetings: Not on file  . Marital Status: Not on file  Intimate Partner Violence:   . Fear of Current or Ex-Partner: Not on file  . Emotionally Abused: Not on file  . Physically Abused: Not on  file  . Sexually Abused: Not on file    No facility-administered medications prior to visit.   Outpatient Medications Prior to Visit  Medication Sig Dispense Refill  . buPROPion (WELLBUTRIN XL) 150 MG 24 hr tablet Take 150 mg by mouth daily.    . hydrOXYzine (ATARAX/VISTARIL) 50 MG tablet Take 50-100 mg by mouth at bedtime as needed (for sleep).    . naltrexone (DEPADE) 50 MG tablet Take 50 mg by mouth daily.    . promethazine (PHENERGAN) 25 MG tablet Take 1 tablet (25 mg total) by mouth every 6 (six) hours as needed for nausea or vomiting. 28 tablet 0  . pantoprazole (PROTONIX) 40 MG tablet Take 1 tablet (40 mg total) by mouth daily. (Patient not taking: Reported  on 08/06/2019) 60 tablet 0    Allergies  Allergen Reactions  . Ondansetron Swelling    Swelling (site not acknowledged)  . Penicillins Diarrhea    Did it involve swelling of the face/tongue/throat, SOB, or low BP? No Did it involve sudden or severe rash/hives, skin peeling, or any reaction on the inside of your mouth or nose? No Did you need to seek medical attention at a hospital or doctor's office? No When did it last happen? Within the past 10 years If all above answers are "NO", may proceed with cephalosporin use.   . Sulfa Antibiotics Rash    ROS Review of Systems  Constitutional: Negative.   HENT: Negative.   Eyes: Negative.   Respiratory: Negative.   Cardiovascular: Negative.   Endocrine: Negative.   Genitourinary: Negative.   Allergic/Immunologic: Negative.   Neurological: Negative.   Hematological: Negative.   Psychiatric/Behavioral: Negative.       Objective:    Physical Exam  Constitutional: She is oriented to person, place, and time. She appears well-developed and well-nourished.  HENT:  Head: Normocephalic and atraumatic.  Eyes: Conjunctivae are normal.  Cardiovascular: Normal rate, regular rhythm, normal heart sounds and intact distal pulses.  Pulmonary/Chest: Effort normal and breath sounds normal.  Abdominal: Soft. Bowel sounds are normal.  Musculoskeletal:        General: Normal range of motion.     Cervical back: Normal range of motion and neck supple.  Neurological: She is alert and oriented to person, place, and time. She has normal reflexes.  Skin: Skin is warm and dry.  Psychiatric: She has a normal mood and affect. Her behavior is normal. Judgment and thought content normal.  Nursing note and vitals reviewed.   BP 124/80   Pulse 85   Ht 5\' 6"  (1.676 m)   Wt 184 lb 3.2 oz (83.6 kg)   LMP 07/05/2019   SpO2 98%   BMI 29.73 kg/m  Wt Readings from Last 3 Encounters:  08/08/19 177 lb 7.5 oz (80.5 kg)  08/06/19 184 lb 3.2 oz (83.6 kg)      Health Maintenance Due  Topic Date Due  . TETANUS/TDAP  12/11/2007  . PAP SMEAR-Modifier  12/10/2009    There are no preventive Castillo reminders to display for this patient.  No results found for: TSH Lab Results  Component Value Date   WBC 16.6 (H) 08/07/2019   HGB 16.5 (H) 08/07/2019   HCT 47.0 (H) 08/07/2019   MCV 97.1 08/07/2019   PLT 329 08/07/2019   Lab Results  Component Value Date   NA 134 (L) 08/08/2019   K 3.1 (L) 08/08/2019   CO2 19 (L) 08/08/2019   GLUCOSE 139 (H) 08/08/2019   BUN 10 08/08/2019  CREATININE 0.87 08/08/2019   BILITOT 1.1 08/08/2019   ALKPHOS 33 (L) 08/08/2019   AST 24 08/08/2019   ALT 32 08/08/2019   PROT 6.5 08/08/2019   ALBUMIN 4.0 08/08/2019   CALCIUM 8.6 (L) 08/08/2019   ANIONGAP 13 08/08/2019   No results found for: CHOL No results found for: HDL No results found for: LDLCALC No results found for: TRIG No results found for: [facility] Lab Results  Component Value Date   HGBA1C 4.5 08/06/2019      Assessment & Plan:   1. Hospital discharge follow-up  2. Encounter to establish Castillo  3. History of colitis - Ambulatory referral to Gastroenterology - pantoprazole (PROTONIX) 40 MG tablet; Take 1 tablet (40 mg total) by mouth 2 (two) times daily.  Dispense: 60 tablet; Refill: 6  4. Generalized abdominal pain - Ambulatory referral to Gastroenterology  5. Hyperglycemia  6. Hypocalcemia  7. Hypokalemia  8. Anxiety - ALPRAZolam (XANAX) 0.5 MG tablet; Take 1 tablet (0.5 mg total) by mouth at bedtime as needed for anxiety.  Dispense: 30 tablet; Refill: 0  9. Panic attacks We will initiate a trial of Alprazolam today. Discussed with patient that she will need to follow up with Psychiatrist for evaluation of medication refills.  - ALPRAZolam (XANAX) 0.5 MG tablet; Take 1 tablet (0.5 mg total) by mouth at bedtime as needed for anxiety.  Dispense: 30 tablet; Refill: 0  10. Possible pregnancy - POCT urine pregnancy  11.  Health Castillo maintenance - POCT glycosylated hemoglobin (Hb A1C) - POCT urinalysis dipstick - POCT glucose (manual entry)  12. Follow up She will follow up in 6 months.   Meds ordered this encounter  Medications  . ALPRAZolam (XANAX) 0.5 MG tablet    Sig: Take 1 tablet (0.5 mg total) by mouth at bedtime as needed for anxiety.    Dispense:  30 tablet    Refill:  0  . pantoprazole (PROTONIX) 40 MG tablet    Sig: Take 1 tablet (40 mg total) by mouth 2 (two) times daily.    Dispense:  60 tablet    Refill:  6   Orders Placed This Encounter  Procedures  . Ambulatory referral to Gastroenterology  . POCT glycosylated hemoglobin (Hb A1C)  . POCT urinalysis dipstick  . POCT glucose (manual entry)  . POCT urine pregnancy     Referral Orders     Ambulatory referral to Gastroenterology  Kathe Becton,  MSN, FNP-BC [facility] [address] [place], [place] [address] [phone] fax   Problem List Items Addressed This Visit      Other   Abdominal pain   Relevant Orders   Ambulatory referral to Gastroenterology    Other Visit Diagnoses    Hospital discharge follow-up    -  Primary   Encounter to establish Castillo       History of colitis       Relevant Medications   pantoprazole (PROTONIX) 40 MG tablet   Other Relevant Orders   Ambulatory referral to Gastroenterology   Hyperglycemia       Hypocalcemia       Hypokalemia       Anxiety       Relevant Medications   ALPRAZolam (XANAX) 0.5 MG tablet   Panic attacks       Relevant Medications   ALPRAZolam (XANAX) 0.5 MG tablet   Possible pregnancy       Relevant Orders  POCT urine pregnancy (Completed)   Health Castillo maintenance       Relevant Orders   POCT glycosylated hemoglobin (Hb A1C) (Completed)   POCT urinalysis dipstick (Completed)   POCT glucose (manual entry) (Completed)   Follow up          Meds ordered this encounter   Medications  . ALPRAZolam (XANAX) 0.5 MG tablet    Sig: Take 1 tablet (0.5 mg total) by mouth at bedtime as needed for anxiety.    Dispense:  30 tablet    Refill:  0  . pantoprazole (PROTONIX) 40 MG tablet    Sig: Take 1 tablet (40 mg total) by mouth 2 (two) times daily.    Dispense:  60 tablet    Refill:  6    Follow-up: Return in about 6 months (around 02/03/2020).    Azzie Glatter, FNP

## 2019-08-07 ENCOUNTER — Emergency Department (HOSPITAL_COMMUNITY): Payer: Medicaid - Out of State

## 2019-08-07 ENCOUNTER — Ambulatory Visit (HOSPITAL_COMMUNITY)
Admission: EM | Admit: 2019-08-07 | Discharge: 2019-08-07 | Disposition: A | Discharge status: Home / Self Care | Payer: Medicaid - Out of State | Attending: Physician Assistant | Admitting: Physician Assistant

## 2019-08-07 ENCOUNTER — Encounter (HOSPITAL_COMMUNITY): Payer: Self-pay

## 2019-08-07 ENCOUNTER — Other Ambulatory Visit: Payer: Self-pay

## 2019-08-07 ENCOUNTER — Inpatient Hospital Stay (HOSPITAL_COMMUNITY)
Admission: EM | Admit: 2019-08-07 | Discharge: 2019-08-11 | DRG: 918 | Disposition: A | Discharge status: Home / Self Care | Payer: Medicaid - Out of State | Attending: Internal Medicine | Admitting: Internal Medicine

## 2019-08-07 DIAGNOSIS — D72829 Elevated white blood cell count, unspecified: Secondary | ICD-10-CM | POA: Diagnosis present

## 2019-08-07 DIAGNOSIS — R109 Unspecified abdominal pain: Secondary | ICD-10-CM | POA: Diagnosis present

## 2019-08-07 DIAGNOSIS — F329 Major depressive disorder, single episode, unspecified: Secondary | ICD-10-CM | POA: Diagnosis present

## 2019-08-07 DIAGNOSIS — Z79899 Other long term (current) drug therapy: Secondary | ICD-10-CM

## 2019-08-07 DIAGNOSIS — N83209 Unspecified ovarian cyst, unspecified side: Secondary | ICD-10-CM | POA: Diagnosis present

## 2019-08-07 DIAGNOSIS — F419 Anxiety disorder, unspecified: Secondary | ICD-10-CM | POA: Diagnosis present

## 2019-08-07 DIAGNOSIS — E872 Acidosis, unspecified: Secondary | ICD-10-CM

## 2019-08-07 DIAGNOSIS — T407X1A Poisoning by cannabis (derivatives), accidental (unintentional), initial encounter: Principal | ICD-10-CM | POA: Diagnosis present

## 2019-08-07 DIAGNOSIS — Z880 Allergy status to penicillin: Secondary | ICD-10-CM

## 2019-08-07 DIAGNOSIS — E876 Hypokalemia: Secondary | ICD-10-CM

## 2019-08-07 DIAGNOSIS — R112 Nausea with vomiting, unspecified: Secondary | ICD-10-CM | POA: Diagnosis present

## 2019-08-07 DIAGNOSIS — E860 Dehydration: Secondary | ICD-10-CM | POA: Diagnosis present

## 2019-08-07 DIAGNOSIS — R1013 Epigastric pain: Secondary | ICD-10-CM | POA: Diagnosis not present

## 2019-08-07 DIAGNOSIS — N179 Acute kidney failure, unspecified: Secondary | ICD-10-CM

## 2019-08-07 DIAGNOSIS — Z20822 Contact with and (suspected) exposure to covid-19: Secondary | ICD-10-CM | POA: Diagnosis present

## 2019-08-07 DIAGNOSIS — Z882 Allergy status to sulfonamides status: Secondary | ICD-10-CM

## 2019-08-07 DIAGNOSIS — F1290 Cannabis use, unspecified, uncomplicated: Secondary | ICD-10-CM | POA: Diagnosis present

## 2019-08-07 DIAGNOSIS — Z888 Allergy status to other drugs, medicaments and biological substances status: Secondary | ICD-10-CM

## 2019-08-07 LAB — COMPREHENSIVE METABOLIC PANEL
ALT: 44 U/L (ref 0–44)
AST: 36 U/L (ref 15–41)
Albumin: 5.3 g/dL — ABNORMAL HIGH (ref 3.5–5.0)
Alkaline Phosphatase: 44 U/L (ref 38–126)
Anion gap: 25 — ABNORMAL HIGH (ref 5–15)
BUN: 12 mg/dL (ref 6–20)
CO2: 12 mmol/L — ABNORMAL LOW (ref 22–32)
Calcium: 10 mg/dL (ref 8.9–10.3)
Chloride: 98 mmol/L (ref 98–111)
Creatinine, Ser: 1.23 mg/dL — ABNORMAL HIGH (ref 0.44–1.00)
GFR calc Af Amer: >60 mL/min (ref >60)
GFR calc non Af Amer: 59 mL/min — ABNORMAL LOW (ref >60)
Glucose, Bld: 119 mg/dL — ABNORMAL HIGH (ref 70–99)
Potassium: 3.1 mmol/L — ABNORMAL LOW (ref 3.5–5.1)
Sodium: 135 mmol/L (ref 135–145)
Total Bilirubin: 1.8 mg/dL — ABNORMAL HIGH (ref 0.3–1.2)
Total Protein: 8.6 g/dL — ABNORMAL HIGH (ref 6.5–8.1)

## 2019-08-07 LAB — URINALYSIS, ROUTINE W REFLEX MICROSCOPIC
Bacteria, UA: NONE SEEN
Bilirubin Urine: NEGATIVE
Glucose, UA: NEGATIVE mg/dL
Ketones, ur: 80 mg/dL — AB
Leukocytes,Ua: NEGATIVE
Nitrite: NEGATIVE
Protein, ur: 100 mg/dL — AB
Specific Gravity, Urine: 1.026 (ref 1.005–1.030)
pH: 5 (ref 5.0–8.0)

## 2019-08-07 LAB — RAPID URINE DRUG SCREEN, HOSP PERFORMED
Amphetamines: NOT DETECTED
Barbiturates: NOT DETECTED
Benzodiazepines: NOT DETECTED
Cocaine: NOT DETECTED
Opiates: NOT DETECTED
Tetrahydrocannabinol: POSITIVE — AB

## 2019-08-07 LAB — CBC
HCT: 47 % — ABNORMAL HIGH (ref 36.0–46.0)
Hemoglobin: 16.5 g/dL — ABNORMAL HIGH (ref 12.0–15.0)
MCH: 34.1 pg — ABNORMAL HIGH (ref 26.0–34.0)
MCHC: 35.1 g/dL (ref 30.0–36.0)
MCV: 97.1 fL (ref 80.0–100.0)
Platelets: 329 10*3/uL (ref 150–400)
RBC: 4.84 MIL/uL (ref 3.87–5.11)
RDW: 11.2 % — ABNORMAL LOW (ref 11.5–15.5)
WBC: 16.6 10*3/uL — ABNORMAL HIGH (ref 4.0–10.5)
nRBC: 0 % (ref 0.0–0.2)

## 2019-08-07 LAB — LIPASE, BLOOD: Lipase: 24 U/L (ref 11–51)

## 2019-08-07 LAB — I-STAT BETA HCG BLOOD, ED (MC, WL, AP ONLY): I-stat hCG, quantitative: <5 m[IU]/mL (ref <5)

## 2019-08-07 MED ORDER — ALUM & MAG HYDROXIDE-SIMETH 200-200-20 MG/5ML PO SUSP
15.0000 mL | Freq: Once | ORAL | Status: AC
  Administered 2019-08-07: 22:00:00 15 mL via ORAL
  Filled 2019-08-07: qty 30

## 2019-08-07 MED ORDER — ALUM & MAG HYDROXIDE-SIMETH 200-200-20 MG/5ML PO SUSP
30.0000 mL | Freq: Once | ORAL | Status: AC
  Administered 2019-08-07: 30 mL via ORAL

## 2019-08-07 MED ORDER — IOHEXOL 300 MG/ML  SOLN
100.0000 mL | Freq: Once | INTRAMUSCULAR | Status: AC | PRN
  Administered 2019-08-07: 21:00:00 100 mL via INTRAVENOUS

## 2019-08-07 MED ORDER — HYDROMORPHONE HCL 1 MG/ML IJ SOLN
1.0000 mg | Freq: Once | INTRAMUSCULAR | Status: AC
  Administered 2019-08-07: 19:00:00 1 mg via INTRAVENOUS
  Filled 2019-08-07: qty 1

## 2019-08-07 MED ORDER — PROMETHAZINE HCL 25 MG/ML IJ SOLN
25.0000 mg | Freq: Once | INTRAMUSCULAR | Status: AC
  Administered 2019-08-07: 22:00:00 25 mg via INTRAVENOUS
  Filled 2019-08-07: qty 1

## 2019-08-07 MED ORDER — HYOSCYAMINE SULFATE 0.125 MG SL SUBL: 0.2500 mg | SUBLINGUAL_TABLET | Freq: Once | SUBLINGUAL | Status: DC

## 2019-08-07 MED ORDER — METOCLOPRAMIDE HCL 5 MG/ML IJ SOLN
10.0000 mg | Freq: Once | INTRAMUSCULAR | Status: AC
  Administered 2019-08-08: 10 mg via INTRAVENOUS
  Filled 2019-08-07: qty 2

## 2019-08-07 MED ORDER — LIDOCAINE VISCOUS HCL 2 % MT SOLN
OROMUCOSAL | Status: AC
  Filled 2019-08-07: qty 15

## 2019-08-07 MED ORDER — PROMETHAZINE HCL 25 MG/ML IJ SOLN
25.0000 mg | Freq: Once | INTRAMUSCULAR | Status: AC
  Administered 2019-08-07: 25 mg via INTRAVENOUS
  Filled 2019-08-07: qty 1

## 2019-08-07 MED ORDER — LIDOCAINE VISCOUS HCL 2 % MT SOLN: 15.0000 mL | Freq: Once | OROMUCOSAL | Status: DC

## 2019-08-07 MED ORDER — HYDROMORPHONE HCL 1 MG/ML IJ SOLN
1.0000 mg | Freq: Once | INTRAMUSCULAR | Status: AC
  Administered 2019-08-07: 1 mg via INTRAVENOUS
  Filled 2019-08-07: qty 1

## 2019-08-07 MED ORDER — ALUM & MAG HYDROXIDE-SIMETH 200-200-20 MG/5ML PO SUSP
ORAL | Status: AC
  Filled 2019-08-07: qty 30

## 2019-08-07 MED ORDER — POTASSIUM CHLORIDE CRYS ER 20 MEQ PO TBCR
40.0000 meq | EXTENDED_RELEASE_TABLET | Freq: Once | ORAL | Status: AC
  Administered 2019-08-07: 22:00:00 40 meq via ORAL
  Filled 2019-08-07: qty 2

## 2019-08-07 MED ORDER — LIDOCAINE VISCOUS HCL 2 % MT SOLN
15.0000 mL | Freq: Once | OROMUCOSAL | Status: AC
  Administered 2019-08-07: 15 mL via ORAL

## 2019-08-07 MED ORDER — ONDANSETRON HCL 4 MG/2ML IJ SOLN
4.0000 mg | Freq: Once | INTRAMUSCULAR | Status: AC
  Administered 2019-08-07: 4 mg via INTRAMUSCULAR

## 2019-08-07 MED ORDER — ONDANSETRON HCL 4 MG/2ML IJ SOLN
INTRAMUSCULAR | Status: AC
  Filled 2019-08-07: qty 2

## 2019-08-07 MED ORDER — SODIUM CHLORIDE 0.9 % IV BOLUS
1000.0000 mL | Freq: Once | INTRAVENOUS | Status: AC
  Administered 2019-08-07: 1000 mL via INTRAVENOUS

## 2019-08-07 MED ORDER — PANTOPRAZOLE SODIUM 40 MG IV SOLR
40.0000 mg | Freq: Once | INTRAVENOUS | Status: AC
  Administered 2019-08-07: 22:00:00 40 mg via INTRAVENOUS
  Filled 2019-08-07: qty 40

## 2019-08-07 MED ORDER — SODIUM CHLORIDE 0.9 % IV BOLUS
1000.0000 mL | Freq: Once | INTRAVENOUS | Status: AC
  Administered 2019-08-07: 22:00:00 1000 mL via INTRAVENOUS

## 2019-08-07 MED ORDER — ALUM & MAG HYDROXIDE-SIMETH 200-200-20 MG/5ML PO SUSP: 30.0000 mL | Freq: Once | ORAL | Status: DC

## 2019-08-07 MED ORDER — POTASSIUM CHLORIDE 10 MEQ/100ML IV SOLN
10.0000 meq | Freq: Once | INTRAVENOUS | Status: AC
  Administered 2019-08-08: 10 meq via INTRAVENOUS
  Filled 2019-08-07: qty 100

## 2019-08-07 MED ORDER — CAPSAICIN 0.025 % EX CREA
TOPICAL_CREAM | Freq: Once | CUTANEOUS | Status: AC
  Filled 2019-08-07: qty 60

## 2019-08-07 MED ORDER — KETOROLAC TROMETHAMINE 30 MG/ML IJ SOLN
30.0000 mg | Freq: Once | INTRAMUSCULAR | Status: AC
  Administered 2019-08-07: 21:00:00 30 mg via INTRAVENOUS
  Filled 2019-08-07: qty 1

## 2019-08-07 NOTE — ED Notes (Signed)
Patient is being discharged from the Urgent [facility] and sent to the Emergency Department via wheelchair by staff. Per Edison Nasuti, [place], patient is stable but in need of higher level of care due to abdominal complaint and patient history. Patient is aware and verbalizes understanding of plan of care.  Vitals:   08/07/19 1554  BP: 139/87  Pulse: 100  Resp: 18  Temp: 98.4 F (36.9 C)  SpO2: 100%

## 2019-08-07 NOTE — ED Notes (Signed)
Lab to add on rapid drug urine.

## 2019-08-07 NOTE — ED Provider Notes (Signed)
[facility] EMERGENCY DEPARTMENT Provider Note   CSN: [id number] Arrival date & time: 08/07/19  1718     History Chief Complaint  Patient presents with  . Abdominal Pain  . Emesis    Marissa Castillo is a 29 y.o. female with PMH significant for gastritis and pancreatitis who presents to the ED with 2-day history of progressively worsening 10 out of 10 RUQ pain with uncontrolled nausea and vomiting.  Patient reports that she has had approximately 50 episodes of nonbilious, nonbloody emesis.  She reports that every time she tries to eat, it comes right back up.  She has been on a trial of proton pump inhibitors, however discontinued it several months ago.  She has been instructed after discharge from hospital 05/12/2019 for gastritis to get established with a gastroenterologist, however admits that she has not yet made a phone call because she is afraid that they will find that she has IBD or another autoimmune disease.  She reports a chronic history of loose stools with intermittent melena, however none recently.  She has a history of pancreatitis which she attributes to drinking a lot of alcohol when she was younger, however she now rarely drinks.  She does however endorse regular marijuana use, most recently prior to arrival here today in effort to quell her nausea symptoms.  While her abdominal pain is most notably in RUQ with radiation to right subscapular region, she also endorses epigastric burning discomfort.  No obvious aggravating or alleviating factors.  She reports that she has been unable to eat or drink much of anything in the past 48 hours.  She has Phenergan p.o., however has been unable to keep them down.  She denies any fevers, chills, chest pain or difficulty breathing, urinary symptoms, vaginal pain or discharge, vaginal bleeding, or other neurologic symptoms.  HPI     Past Medical History:  Diagnosis Date  . Anxiety   . Chronic right upper quadrant pain 07/2019   . Colitis   . Depression   . Gastritis 07/2019  . Pancreatitis     Patient Active Problem List   Diagnosis Date Noted  . Intractable nausea and vomiting 08/08/2019  . Gastritis 05/13/2019  . Abdominal pain 05/13/2019  . Dehydration 05/13/2019  . High anion gap metabolic acidosis 99991111  . Bipolar disorder in remission ([place]) 08/05/2016  . PTSD (post-traumatic stress disorder) 07/15/2016  . 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 WISDOM TOOTH EXTRACTION       OB History    Gravida  1   Para      Term      Preterm      AB      Living        SAB      TAB      Ectopic      Multiple      Live Births              Family History  Problem Relation Age of Onset  . Breast cancer Mother     Social History   Tobacco Use  . Smoking status: Never Smoker  . Smokeless tobacco: Never Used  Substance Use Topics  . Alcohol use: Yes    Comment: SOCIAL  . Drug use: Never    Home Medications Prior to Admission medications   Medication Sig Start Date End Date Taking? Authorizing Provider  ALPRAZolam Duanne Moron) 0.5 MG tablet  Take 1 tablet (0.5 mg total) by mouth at bedtime as needed for anxiety. 08/06/19   Azzie Glatter, FNP  buPROPion (WELLBUTRIN XL) 150 MG 24 hr tablet Take 150 mg by mouth daily. 04/27/19   [provider]  hydrOXYzine (ATARAX/VISTARIL) 50 MG tablet Take 50-100 mg by mouth at bedtime as needed (for sleep).    [provider]  naltrexone (DEPADE) 50 MG tablet Take 50 mg by mouth daily. 04/27/19   [provider]  pantoprazole (PROTONIX) 40 MG tablet Take 1 tablet (40 mg total) by mouth 2 (two) times daily. 08/06/19   Azzie Glatter, FNP  promethazine (PHENERGAN) 25 MG tablet Take 1 tablet (25 mg total) by mouth every 6 (six) hours as needed for nausea or vomiting. Patient not taking: Reported on 08/06/2019 05/14/19   Jeanmarie Hubert, MD    Allergies     Ondansetron, Penicillins, and Sulfa antibiotics  Review of Systems   Review of Systems  All other systems reviewed and are negative.   Physical Exam Updated Vital Signs BP 110/82   Pulse 87   Temp 99 F (37.2 C) (Oral)   Resp 16   Ht 5\' 6"  (1.676 m)   Wt 81.6 kg   LMP 07/17/2019 Comment: Negative HCG in ED today  SpO2 99%   BMI 29.05 kg/m   Physical Exam Vitals and nursing note reviewed. Exam conducted with a chaperone present.  Constitutional:      General: She is not in acute distress.    Appearance: Normal appearance. She is not ill-appearing.  HENT:     Head: Normocephalic and atraumatic.  Eyes:     General: No scleral icterus.    Conjunctiva/sclera: Conjunctivae normal.  Cardiovascular:     Rate and Rhythm: Normal rate and regular rhythm.     Pulses: Normal pulses.     Heart sounds: Normal heart sounds.  Pulmonary:     Effort: Pulmonary effort is normal. No respiratory distress.     Breath sounds: Normal breath sounds.  Abdominal:     Comments: Soft, nondistended.  Significant TTP in RUQ.  Positive Murphy sign.  Negative McBurney's point tenderness.  Negative Rovsing sign.  Moderate TTP in epigastrium.  No significant TTP elsewhere.  No guarding.  No masses appreciated.  No overlying skin changes.  Normoctive bowel sounds.  Musculoskeletal:     Cervical back: Normal range of motion and neck supple.  Skin:    General: Skin is dry.     Capillary Refill: Capillary refill takes less than 2 seconds.  Neurological:     Mental Status: She is alert and oriented to person, place, and time.     GCS: GCS eye subscore is 4. GCS verbal subscore is 5. GCS motor subscore is 6.  Psychiatric:        Mood and Affect: Mood normal.        Behavior: Behavior normal.        Thought Content: Thought content normal.     ED Results / Procedures / Treatments   Labs (all labs ordered are listed, but only abnormal results are displayed) Labs Reviewed  COMPREHENSIVE METABOLIC  PANEL - Abnormal; Notable for the following components:      Result Value   Potassium 3.1 (*)    CO2 12 (*)    Glucose, Bld 119 (*)    Creatinine, Ser 1.23 (*)    Total Protein 8.6 (*)    Albumin 5.3 (*)    Total Bilirubin 1.8 (*)  GFR calc non Af Amer 59 (*)    Anion gap 25 (*)    All other components within normal limits  CBC - Abnormal; Notable for the following components:   WBC 16.6 (*)    Hemoglobin 16.5 (*)    HCT 47.0 (*)    MCH 34.1 (*)    RDW 11.2 (*)    All other components within normal limits  URINALYSIS, ROUTINE W REFLEX MICROSCOPIC - Abnormal; Notable for the following components:   APPearance HAZY (*)    Hgb urine dipstick LARGE (*)    Ketones, ur 80 (*)    Protein, ur 100 (*)    All other components within normal limits  RAPID URINE DRUG SCREEN, HOSP PERFORMED - Abnormal; Notable for the following components:   Tetrahydrocannabinol POSITIVE (*)    All other components within normal limits  SARS CORONAVIRUS 2 (TAT 6-24 HRS)  LIPASE, BLOOD  I-STAT BETA HCG BLOOD, ED ([facility], [facility], [facility] ONLY)    EKG None  Radiology CT ABDOMEN PELVIS W CONTRAST  Result Date: 08/07/2019 CLINICAL DATA:  29 year old female with abdominal pain. EXAM: CT ABDOMEN AND PELVIS WITH CONTRAST TECHNIQUE: Multidetector CT imaging of the abdomen and pelvis was performed using the standard protocol following bolus administration of intravenous contrast. CONTRAST:  159mL OMNIPAQUE IOHEXOL 300 MG/ML  SOLN COMPARISON:  CT abdomen pelvis dated 05/13/2019. FINDINGS: Lower chest: The visualized lung bases are clear. No intra-abdominal free air or free fluid. Hepatobiliary: Apparent fatty infiltration of the liver. No intrahepatic biliary ductal dilatation. The gallbladder is unremarkable. Pancreas: Unremarkable. No pancreatic ductal dilatation or surrounding inflammatory changes. Spleen: Normal in size without focal abnormality. Adrenals/Urinary Tract: The adrenal glands are unremarkable. The kidneys,  visualized ureters, and urinary bladder appear unremarkable as well. Stomach/Bowel: There is diffuse thickened appearance of the distal colon without surrounding inflammatory changes likely represents underdistention. Mild colitis is less likely. Clinical correlation is recommended. There is diffuse submucosal fat deposit along the distal colonic wall, likely sequela prior inflammation. There is no bowel obstruction. The appendix is normal. Vascular/Lymphatic: The abdominal aorta and IVC are unremarkable. No portal venous gas. There is no adenopathy. Reproductive: The uterus is anteverted and grossly unremarkable. The right ovary is unremarkable as well. There is a 15 mm dominant cyst or corpus luteum in the left ovary. A 2.5 cm probable paraovarian cyst as seen previously. Ultrasound may provide better evaluation of the pelvic structures if clinically indicated. Other: None Musculoskeletal: No acute or significant osseous findings. IMPRESSION: 1. Underdistention of the distal colon versus less likely mild colitis. Clinical correlation is recommended. No bowel obstruction. Normal appendix. 2. Left ovarian dominant follicle/corpus luteum as well as paraovarian cyst similar to prior CT. Electronically Signed   By: Anner Crete M.D.   On: 08/07/2019 21:41   US Abdomen Limited  Result Date: 08/07/2019 CLINICAL DATA:  29 year old female with right upper quadrant abdominal pain and vomiting. EXAM: ULTRASOUND ABDOMEN LIMITED RIGHT UPPER QUADRANT COMPARISON:  CT abdomen pelvis dated 05/13/2019. FINDINGS: Gallbladder: No gallstones or wall thickening visualized. No sonographic Murphy sign noted by sonographer. Common bile duct: Diameter: 4 mm. Liver: The liver is unremarkable. Portal vein is patent on color Doppler imaging with normal direction of blood flow towards the liver. Other: None. IMPRESSION: Unremarkable right upper quadrant ultrasound. Electronically Signed   By: Anner Crete M.D.   On: 08/07/2019  19:42    Procedures Procedures (including critical care time)  Medications Ordered in ED Medications  capsaicin (ZOSTRIX) 0.025 %  cream (has no administration in time range)  potassium chloride 10 mEq in 100 mL IVPB ( Intravenous Rate/Dose Change 08/08/19 0016)  sodium chloride 0.9 % bolus 1,000 mL (0 mLs Intravenous Stopped 08/07/19 2230)  promethazine (PHENERGAN) injection 25 mg (25 mg Intravenous Given 08/07/19 1900)  HYDROmorphone (DILAUDID) injection 1 mg (1 mg Intravenous Given 08/07/19 1920)  ketorolac (TORADOL) 30 MG/ML injection 30 mg (30 mg Intravenous Given 08/07/19 2033)  iohexol (OMNIPAQUE) 300 MG/ML solution 100 mL (100 mLs Intravenous Contrast Given 08/07/19 2129)  promethazine (PHENERGAN) injection 25 mg (25 mg Intravenous Given 08/07/19 2152)  potassium chloride SA (KLOR-CON) CR tablet 40 mEq (40 mEq Oral Given 08/07/19 2228)  pantoprazole (PROTONIX) injection 40 mg (40 mg Intravenous Given 08/07/19 2158)  alum & mag hydroxide-simeth (MAALOX/MYLANTA) 200-200-20 MG/5ML suspension 15 mL (15 mLs Oral Given 08/07/19 2227)  sodium chloride 0.9 % bolus 1,000 mL (0 mLs Intravenous Stopped 08/08/19 0011)  HYDROmorphone (DILAUDID) injection 1 mg (1 mg Intravenous Given 08/07/19 2332)  metoCLOPramide (REGLAN) injection 10 mg (10 mg Intravenous Given 08/08/19 0007)    ED Course  I have reviewed the triage vital signs and the nursing notes.  Pertinent labs & imaging results that were available during my care of the patient were reviewed by me and considered in my medical decision making (see chart for details).  Clinical Course as of Aug 07 17  Sun Aug 08, 2019  0017 Spoke with hospitalist who will evaluate patient for admission due to her intractable nausea and vomiting.    [GG]    Clinical Course User Index [GG] Corena Herter, PA-C   MDM Rules/Calculators/A&P                      CMP notable for hypokalemia to 3.1.  Her CBC also demonstrates a leukocytosis to 16.6.  Her UA  demonstrated 80 ketones and a specific gravity of 1.026, suggesting dehydration.  Creatinine demonstrated mild bump to 1.23, up from her recent labs.  This is likely suggestive of an acute kidney injury secondary to prerenal azotemia.  Resuscitated patient with 2 L NS and 40 mEq K-Dur which she was able to tolerate by mouth.  She also has a ketonuria and large anion gap, likely metabolic acidosis that she had similarly during previous admission that resolved with IV fluids and pantoprazole.  I reviewed her chart and she was evaluated by [facility], however she is informing me that she needs to get established with a separate gastroenterologist due to insurance concerns.    She has been intermittently nauseated during her ED stay here today despite IV antiemetics with numerous episodes of clear, nonbloody nonbilious emesis.  Limited abdominal ultrasound performed on RUQ demonstrated no acute abnormalities.  After engaging in mutual decision with patient, proceeded to obtain CT abdomen and pelvis with contrast to assess for appendicitis or other possible etiologies of her significant abdominal pain with associated nausea and vomiting.  After learning of her regular marijuana use, will strongly consider cannabinoid induced hyperemesis syndrome.  On reevaluation, she is also endorsing that her symptoms seem to improve with warm baths that she takes multiple times during these episodes of cyclic nausea and vomiting.  I reviewed CT and pelvis which demonstrated normal appendix and no significant findings.  Findings consistent with under distention of distal colon which is consistent with patient's story of diminished p.o. intake.  Bowel wall thickening less likely to be evidence of a colitis, however patient endorses history of  colitis.  If patient can tolerate food and liquid by mouth, feels that she is safe for discharge with outpatient follow-up with gastroenterology.    After discussing discharge  home with Phenergan suppository medications, she once again began to vomit.  She is afraid to go home as she is confident that her uncontrolled emesis and abdominal discomfort will continue to persist.  Will obtain COVID-19 testing and contact hospitalist for admission in setting of intractable nausea and vomiting, likely attributable to cannabinoid-induced hyperemesis syndrome (CHS).     Final Clinical Impression(s) / ED Diagnoses Final diagnoses:  Intractable nausea and vomiting    Rx / DC Orders ED Discharge Orders    None       Corena Herter, PA-C 08/08/19 0019    Tegeler, Gwenyth Allegra, MD 08/08/19 [phone]

## 2019-08-07 NOTE — ED Notes (Signed)
Pt transported to CT ?

## 2019-08-07 NOTE — ED Notes (Signed)
Pt transported to US

## 2019-08-07 NOTE — ED Triage Notes (Signed)
Pt reports RUQ pain and emesis for the past 2 days, unable to keep anything down. Pt has hx of gastritis. Pt has PO phenergan at home but unable to take due to vomiting.

## 2019-08-07 NOTE — ED Provider Notes (Signed)
[place]    CSN: [id number] Arrival date & time: 08/07/19  1533      History   Chief Complaint Chief Complaint  Patient presents with  . Emesis    HPI Marissa Castillo is a 29 y.o. female.   Patient with past medical history of pancreatitis and gastritis presents to urgent care with 24-hour history of intractable nausea and vomiting.  She also has associated upper abdominal pain which she describes as being in the center into the right primarily. she reports numerous episodes of vomiting and has only been able to keep down small sips of water occasionally and has stopped her from sleeping.  She denies blood in her vomit.  She reports she attempted to take Phenergan tablets for her nausea and vomiting however she vomited this back up.  She denies fever, chills, diarrhea.  She denies back pain she denies change in her urinary patterns and painful urination.  She reports a history of drinking however does not endorse significant amounts of alcohol intake in the last few days.  She reports having one sip last night at dinner and this appeared to prompt her episodes of vomiting.   She does report a headache that has developed throughout the day.  Headache is primarily in the front of her head.    Patient was admitted to the hospital 05/13/2019 for similar presentation gastritis.  She had associated anion gap into her frequent vomiting at the time.  She had follow-up primary care yesterday discharge follow-up and was given a referral for GI specialist for future upper and lower endoscopy.     Past Medical History:  Diagnosis Date  . Anxiety   . Chronic right upper quadrant pain 07/2019  . Colitis   . Depression   . Gastritis 07/2019  . Pancreatitis     Patient Active Problem List   Diagnosis Date Noted  . Gastritis 05/13/2019  . Abdominal pain 05/13/2019  . Dehydration 05/13/2019  . High anion gap metabolic acidosis 99991111  . Bipolar disorder in remission ([place])  08/05/2016  . PTSD (post-traumatic stress disorder) 07/15/2016  . 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 WISDOM TOOTH EXTRACTION      OB History    Gravida  1   Para      Term      Preterm      AB      Living        SAB      TAB      Ectopic      Multiple      Live Births               Home Medications    Prior to Admission medications   Medication Sig Start Date End Date Taking? Authorizing Provider  ALPRAZolam Duanne Moron) 0.5 MG tablet Take 1 tablet (0.5 mg total) by mouth at bedtime as needed for anxiety. 08/06/19   Azzie Glatter, FNP  buPROPion (WELLBUTRIN XL) 150 MG 24 hr tablet Take 150 mg by mouth daily. 04/27/19   [provider]  hydrOXYzine (ATARAX/VISTARIL) 50 MG tablet Take 50-100 mg by mouth at bedtime as needed (for sleep).    [provider]  naltrexone (DEPADE) 50 MG tablet Take 50 mg by mouth daily. 04/27/19   [provider]  pantoprazole (PROTONIX) 40 MG tablet Take 1 tablet (40 mg total) by mouth 2 (two) times daily. 08/06/19  Azzie Glatter, FNP  promethazine (PHENERGAN) 25 MG tablet Take 1 tablet (25 mg total) by mouth every 6 (six) hours as needed for nausea or vomiting. Patient not taking: Reported on 08/06/2019 05/14/19   Jeanmarie Hubert, MD    Family History Family History  Problem Relation Age of Onset  . Breast cancer Mother     Social History Social History   Tobacco Use  . Smoking status: Never Smoker  . Smokeless tobacco: Never Used  Substance Use Topics  . Alcohol use: Yes    Comment: SOCIAL  . Drug use: Never     Allergies   Ondansetron, Penicillins, and Sulfa antibiotics   Review of Systems Review of Systems  Constitutional: Positive for fatigue. Negative for chills and fever.  Eyes: Negative for pain and visual disturbance.  Respiratory: Negative for cough and shortness of breath.   Cardiovascular: Negative for  chest pain and palpitations.  Gastrointestinal: Positive for abdominal pain, nausea and vomiting. Negative for blood in stool, constipation and diarrhea.  Genitourinary: Negative for dysuria and hematuria.  Musculoskeletal: Negative for arthralgias, back pain and myalgias.  Skin: Negative for color change and rash.  Neurological: Positive for headaches. Negative for dizziness, seizures, syncope and weakness.  All other systems reviewed and are negative.    Physical Exam Triage Vital Signs ED Triage Vitals  Enc Vitals Group     BP 08/07/19 1554 139/87     Pulse Rate 08/07/19 1554 100     Resp 08/07/19 1554 18     Temp 08/07/19 1554 98.4 F (36.9 C)     Temp Source 08/07/19 1554 Oral     SpO2 08/07/19 1554 100 %     Weight --      Height --      Head Circumference --      Peak Flow --      Pain Score 08/07/19 1553 9     Pain Loc --      Pain Edu? --      Excl. in [place]? --    No data found.  Updated Vital Signs BP 139/87 (BP Location: Left Arm)   Pulse 100   Temp 98.4 F (36.9 C) (Oral)   Resp 18   LMP 07/17/2019   SpO2 100%   Visual Acuity Right Eye Distance:   Left Eye Distance:   Bilateral Distance:    Right Eye Near:   Left Eye Near:    Bilateral Near:     Physical Exam Vitals and nursing note reviewed.  Constitutional:      General: She is not in acute distress.    Appearance: She is well-developed. She is ill-appearing and diaphoretic.     Comments: Ill-appearing female seated on the exam table with bag of vomit in her hand.  HENT:     Head: Normocephalic and atraumatic.  Eyes:     Extraocular Movements: Extraocular movements intact.     Conjunctiva/sclera: Conjunctivae normal.     Pupils: Pupils are equal, round, and reactive to light.  Cardiovascular:     Rate and Rhythm: Tachycardia present.  Pulmonary:     Effort: Pulmonary effort is normal. No respiratory distress.  Abdominal:     Palpations: Abdomen is soft.     Tenderness: There is abdominal  tenderness (RUQ and epigastric).  Skin:    General: Skin is warm.     Findings: No erythema or rash.  Neurological:     General: No focal deficit present.  Mental Status: She is alert and oriented to person, place, and time.  Psychiatric:        Mood and Affect: Mood normal.        Behavior: Behavior normal.        Thought Content: Thought content normal.        Judgment: Judgment normal.      UC Treatments / Results  Labs (all labs ordered are listed, but only abnormal results are displayed) Labs Reviewed - No data to display  EKG   Radiology US Abdomen Limited  Result Date: 08/07/2019 CLINICAL DATA:  29 year old female with right upper quadrant abdominal pain and vomiting. EXAM: ULTRASOUND ABDOMEN LIMITED RIGHT UPPER QUADRANT COMPARISON:  CT abdomen pelvis dated 05/13/2019. FINDINGS: Gallbladder: No gallstones or wall thickening visualized. No sonographic Murphy sign noted by sonographer. Common bile duct: Diameter: 4 mm. Liver: The liver is unremarkable. Portal vein is patent on color Doppler imaging with normal direction of blood flow towards the liver. Other: None. IMPRESSION: Unremarkable right upper quadrant ultrasound. Electronically Signed   By: Anner Crete M.D.   On: 08/07/2019 19:42    Procedures Procedures (including critical care time)  Medications Ordered in UC Medications  alum & mag hydroxide-simeth (MAALOX/MYLANTA) 200-200-20 MG/5ML suspension 30 mL (30 mLs Oral Given 08/07/19 1655)    And  lidocaine (XYLOCAINE) 2 % viscous mouth solution 15 mL (15 mLs Oral Given 08/07/19 1655)  ondansetron (ZOFRAN) injection 4 mg (4 mg Intramuscular Given 08/07/19 1700)    Initial Impression / Assessment and Plan / UC Course  I have reviewed the triage vital signs and the nursing notes.  Pertinent labs & imaging results that were available during my care of the patient were reviewed by me and considered in my medical decision making (see chart for details).      #Abdominal pain #Intractable nausea and vomiting Patient is a 29 year old female with past medical history of pancreatitis and gastritis presenting with intractable nausea and vomiting with associated upper abdominal pain.  Given her history and the fact that she had a recent hospitalization for similar presentation and found to have a gap acidosis and her current presenting symptoms do feel that it would be best for her to be further evaluated in the emergency department.  She did have mild relief of her epigastric pain with GI cocktail in clinic, however I do not believe that symptomatic care will be adequate.  I discussed this with the patient and she agreed to go to the emergency department for further evaluation of concern for possible biliary or pancreatic etiologies. -Zofran given in clinic.   Final Clinical Impressions(s) / UC Diagnoses   Final diagnoses:  Intractable nausea and vomiting  Abdominal pain, epigastric     Discharge Instructions     Given your presenting symptoms and your history I feel that it would be best for you to be further evaluated in the emergency department today.  Like for you to go there following receiving the nausea medication.    ED Prescriptions    None     PDMP not reviewed this encounter.   Purnell Shoemaker, PA-C 08/07/19 2054

## 2019-08-07 NOTE — Discharge Instructions (Signed)
Given your presenting symptoms and your history I feel that it would be best for you to be further evaluated in the emergency department today.  Like for you to go there following receiving the nausea medication.

## 2019-08-07 NOTE — ED Triage Notes (Signed)
Pt has been vomiting for two days, pt states that she has not been able to eat and food.  Pt has been taking her phenergan pills and vomited them right back up.

## 2019-08-08 ENCOUNTER — Encounter: Payer: Self-pay | Admitting: Family Medicine

## 2019-08-08 DIAGNOSIS — T407X1A Poisoning by cannabis (derivatives), accidental (unintentional), initial encounter: Secondary | ICD-10-CM | POA: Diagnosis not present

## 2019-08-08 DIAGNOSIS — N179 Acute kidney failure, unspecified: Secondary | ICD-10-CM | POA: Diagnosis present

## 2019-08-08 DIAGNOSIS — F410 Panic disorder [episodic paroxysmal anxiety] without agoraphobia: Secondary | ICD-10-CM | POA: Insufficient documentation

## 2019-08-08 DIAGNOSIS — F329 Major depressive disorder, single episode, unspecified: Secondary | ICD-10-CM | POA: Diagnosis present

## 2019-08-08 DIAGNOSIS — Z8719 Personal history of other diseases of the digestive system: Secondary | ICD-10-CM | POA: Insufficient documentation

## 2019-08-08 DIAGNOSIS — N83209 Unspecified ovarian cyst, unspecified side: Secondary | ICD-10-CM | POA: Diagnosis present

## 2019-08-08 DIAGNOSIS — F419 Anxiety disorder, unspecified: Secondary | ICD-10-CM | POA: Insufficient documentation

## 2019-08-08 DIAGNOSIS — Z79899 Other long term (current) drug therapy: Secondary | ICD-10-CM | POA: Diagnosis not present

## 2019-08-08 DIAGNOSIS — R112 Nausea with vomiting, unspecified: Secondary | ICD-10-CM | POA: Diagnosis present

## 2019-08-08 DIAGNOSIS — Z880 Allergy status to penicillin: Secondary | ICD-10-CM | POA: Diagnosis not present

## 2019-08-08 DIAGNOSIS — Z20822 Contact with and (suspected) exposure to covid-19: Secondary | ICD-10-CM | POA: Diagnosis present

## 2019-08-08 DIAGNOSIS — E876 Hypokalemia: Secondary | ICD-10-CM | POA: Diagnosis present

## 2019-08-08 DIAGNOSIS — F1290 Cannabis use, unspecified, uncomplicated: Secondary | ICD-10-CM | POA: Diagnosis present

## 2019-08-08 DIAGNOSIS — Z888 Allergy status to other drugs, medicaments and biological substances status: Secondary | ICD-10-CM | POA: Diagnosis not present

## 2019-08-08 DIAGNOSIS — D72829 Elevated white blood cell count, unspecified: Secondary | ICD-10-CM | POA: Diagnosis present

## 2019-08-08 DIAGNOSIS — F12188 Cannabis abuse with other cannabis-induced disorder: Secondary | ICD-10-CM | POA: Diagnosis not present

## 2019-08-08 DIAGNOSIS — E860 Dehydration: Secondary | ICD-10-CM | POA: Diagnosis present

## 2019-08-08 DIAGNOSIS — E872 Acidosis: Secondary | ICD-10-CM | POA: Diagnosis present

## 2019-08-08 DIAGNOSIS — Z882 Allergy status to sulfonamides status: Secondary | ICD-10-CM | POA: Diagnosis not present

## 2019-08-08 LAB — COMPREHENSIVE METABOLIC PANEL
ALT: 32 U/L (ref 0–44)
AST: 24 U/L (ref 15–41)
Albumin: 4 g/dL (ref 3.5–5.0)
Alkaline Phosphatase: 33 U/L — ABNORMAL LOW (ref 38–126)
Anion gap: 13 (ref 5–15)
BUN: 10 mg/dL (ref 6–20)
CO2: 19 mmol/L — ABNORMAL LOW (ref 22–32)
Calcium: 8.6 mg/dL — ABNORMAL LOW (ref 8.9–10.3)
Chloride: 102 mmol/L (ref 98–111)
Creatinine, Ser: 0.87 mg/dL (ref 0.44–1.00)
GFR calc Af Amer: >60 mL/min (ref >60)
GFR calc non Af Amer: >60 mL/min (ref >60)
Glucose, Bld: 139 mg/dL — ABNORMAL HIGH (ref 70–99)
Potassium: 3.1 mmol/L — ABNORMAL LOW (ref 3.5–5.1)
Sodium: 134 mmol/L — ABNORMAL LOW (ref 135–145)
Total Bilirubin: 1.1 mg/dL (ref 0.3–1.2)
Total Protein: 6.5 g/dL (ref 6.5–8.1)

## 2019-08-08 LAB — LACTIC ACID, PLASMA: Lactic Acid, Venous: 0.9 mmol/L (ref 0.5–1.9)

## 2019-08-08 LAB — MAGNESIUM: Magnesium: 1.8 mg/dL (ref 1.7–2.4)

## 2019-08-08 LAB — SALICYLATE LEVEL: Salicylate Lvl: <7 mg/dL — ABNORMAL LOW (ref 7.0–30.0)

## 2019-08-08 LAB — SARS CORONAVIRUS 2 (TAT 6-24 HRS): SARS Coronavirus 2: NEGATIVE

## 2019-08-08 LAB — ETHANOL: Alcohol, Ethyl (B): <10 mg/dL (ref <10)

## 2019-08-08 MED ORDER — KETOROLAC TROMETHAMINE 30 MG/ML IJ SOLN
30.0000 mg | Freq: Four times a day (QID) | INTRAMUSCULAR | Status: DC | PRN
  Administered 2019-08-08 – 2019-08-10 (×5): 30 mg via INTRAVENOUS
  Filled 2019-08-08 (×5): qty 1

## 2019-08-08 MED ORDER — POTASSIUM CHLORIDE CRYS ER 20 MEQ PO TBCR
40.0000 meq | EXTENDED_RELEASE_TABLET | Freq: Once | ORAL | Status: AC
  Administered 2019-08-08: 40 meq via ORAL
  Filled 2019-08-08: qty 2

## 2019-08-08 MED ORDER — STERILE WATER FOR INJECTION IV SOLN
INTRAVENOUS | Status: DC
  Filled 2019-08-08 (×5): qty 850

## 2019-08-08 MED ORDER — BUPROPION HCL ER (XL) 150 MG PO TB24
150.0000 mg | ORAL_TABLET | Freq: Every day | ORAL | Status: DC
  Administered 2019-08-08 – 2019-08-11 (×4): 150 mg via ORAL
  Filled 2019-08-08 (×4): qty 1

## 2019-08-08 MED ORDER — ACETAMINOPHEN 650 MG RE SUPP: 650.0000 mg | Freq: Four times a day (QID) | RECTAL | Status: DC | PRN

## 2019-08-08 MED ORDER — MORPHINE SULFATE (PF) 2 MG/ML IV SOLN
1.0000 mg | INTRAVENOUS | Status: DC | PRN
  Administered 2019-08-08 – 2019-08-11 (×17): 1 mg via INTRAVENOUS
  Filled 2019-08-08 (×18): qty 1

## 2019-08-08 MED ORDER — PROMETHAZINE HCL 25 MG/ML IJ SOLN
25.0000 mg | Freq: Four times a day (QID) | INTRAMUSCULAR | Status: DC | PRN
  Administered 2019-08-08 – 2019-08-11 (×12): 25 mg via INTRAVENOUS
  Filled 2019-08-08 (×12): qty 1

## 2019-08-08 MED ORDER — ALUM & MAG HYDROXIDE-SIMETH 200-200-20 MG/5ML PO SUSP
30.0000 mL | Freq: Four times a day (QID) | ORAL | Status: DC | PRN
  Administered 2019-08-08 – 2019-08-11 (×5): 30 mL via ORAL
  Filled 2019-08-08 (×6): qty 30

## 2019-08-08 MED ORDER — HYDROXYZINE HCL 25 MG PO TABS: 50.0000 mg | ORAL_TABLET | Freq: Every evening | ORAL | Status: DC | PRN

## 2019-08-08 MED ORDER — PANTOPRAZOLE SODIUM 40 MG IV SOLR
40.0000 mg | Freq: Two times a day (BID) | INTRAVENOUS | Status: DC
  Administered 2019-08-08 – 2019-08-09 (×4): 40 mg via INTRAVENOUS
  Filled 2019-08-08 (×4): qty 40

## 2019-08-08 MED ORDER — ALPRAZOLAM 0.5 MG PO TABS
0.5000 mg | ORAL_TABLET | Freq: Every evening | ORAL | Status: DC | PRN
  Administered 2019-08-08 – 2019-08-10 (×3): 0.5 mg via ORAL
  Filled 2019-08-08 (×3): qty 1

## 2019-08-08 MED ORDER — LIDOCAINE VISCOUS HCL 2 % MT SOLN
15.0000 mL | Freq: Four times a day (QID) | OROMUCOSAL | Status: DC | PRN
  Administered 2019-08-08 – 2019-08-11 (×4): 15 mL via ORAL
  Filled 2019-08-08 (×7): qty 15

## 2019-08-08 MED ORDER — PROCHLORPERAZINE EDISYLATE 10 MG/2ML IJ SOLN
10.0000 mg | INTRAMUSCULAR | Status: DC | PRN
  Administered 2019-08-08 – 2019-08-11 (×11): 10 mg via INTRAVENOUS
  Filled 2019-08-08 (×12): qty 2

## 2019-08-08 MED ORDER — HYDROXYZINE HCL 10 MG PO TABS
10.0000 mg | ORAL_TABLET | Freq: Three times a day (TID) | ORAL | Status: DC | PRN
  Administered 2019-08-11: 10 mg via ORAL
  Filled 2019-08-08 (×4): qty 1

## 2019-08-08 MED ORDER — ACETAMINOPHEN 325 MG PO TABS: 650.0000 mg | ORAL_TABLET | Freq: Four times a day (QID) | ORAL | Status: DC | PRN

## 2019-08-08 MED ORDER — CYCLOBENZAPRINE HCL 5 MG PO TABS
5.0000 mg | ORAL_TABLET | Freq: Three times a day (TID) | ORAL | Status: DC | PRN
  Administered 2019-08-08 – 2019-08-11 (×4): 5 mg via ORAL
  Filled 2019-08-08 (×6): qty 1

## 2019-08-08 MED ORDER — ENOXAPARIN SODIUM 40 MG/0.4ML ~~LOC~~ SOLN
40.0000 mg | SUBCUTANEOUS | Status: DC
  Filled 2019-08-08 (×3): qty 0.4

## 2019-08-08 NOTE — H&P (Addendum)
History and Physical    Tateanna Jernigan [id number] DOB: December 20, 1990 DOA: 08/07/2019  PCP: Patient, No Pcp Per Patient coming from: Home  Chief Complaint: Abdominal pain, nausea, vomiting  HPI: Marissa Castillo is a 29 y.o. female with medical history significant of anxiety, depression, colitis, gastritis, pancreatitis presenting with complaints of abdominal pain, nausea, and vomiting.  Patient reports 3-day history of right upper quadrant abdominal pain and intractable nausea and vomiting.  The pain radiates to her right shoulder blade.  She has not been able to tolerate p.o. intake.  No diarrhea.  She has been smoking marijuana to help with her symptoms.  States she had a sip of an alcoholic beverage at an event a few days ago and vomited soon after.  Since then, she has not consumed any alcohol.  No fevers, chills, cough, or shortness of breath.  ED Course: Afebrile.  Slightly tachycardic on arrival, resolved after IV fluid bolus.  Does have leukocytosis with WBC count 16.6.  Lipase normal.  T bili 1.8, remainder of LFTs normal.  Potassium 3.1.  Bicarb 12, anion gap 25.  Blood glucose 119.  BUN 12, creatinine 1.2.  Baseline creatinine 0.7-0.9.  Beta-hCG negative.  UA with ketones but not suggestive of infection.  UDS positive for THC.  SARS-CoV-2 PCR test pending.  CT abdomen pelvis showing underdistention of the distal colon versus less likely mild colitis.  No bowel obstruction.  Normal appendix.  There is evidence of fatty infiltration of the liver.  No intrahepatic biliary ductal dilatation and gallbladder is unremarkable.  Left ovarian dominant follicle/corpus luteum as well as paraovarian cyst similar to prior CT. Right upper quadrant ultrasound unremarkable. Patient received GI cocktail, Dilaudid, Toradol, Reglan, Protonix, potassium supplementation, Phenergan, and 2 L normal saline boluses.  Review of Systems:  All systems reviewed and apart from history of presenting illness, are  negative.  Past Medical History:  Diagnosis Date  . Anxiety   . Chronic right upper quadrant pain 07/2019  . Colitis   . Depression   . Gastritis 07/2019  . Pancreatitis     Past Surgical History:  Procedure Laterality Date  . WISDOM TOOTH EXTRACTION       reports that she has never smoked. She has never used smokeless tobacco. She reports current alcohol use. She reports that she does not use drugs.  Allergies  Allergen Reactions  . Ondansetron Swelling    Swelling (site not acknowledged)  . Penicillins Diarrhea    Did it involve swelling of the face/tongue/throat, SOB, or low BP? No Did it involve sudden or severe rash/hives, skin peeling, or any reaction on the inside of your mouth or nose? No Did you need to seek medical attention at a hospital or doctor's office? No When did it last happen? Within the past 10 years If all above answers are "NO", may proceed with cephalosporin use.   . Sulfa Antibiotics Rash    Family History  Problem Relation Age of Onset  . Breast cancer Mother     Prior to Admission medications   Medication Sig Start Date End Date Taking? Authorizing Provider  ALPRAZolam Duanne Moron) 0.5 MG tablet Take 1 tablet (0.5 mg total) by mouth at bedtime as needed for anxiety. 08/06/19   Azzie Glatter, FNP  buPROPion (WELLBUTRIN XL) 150 MG 24 hr tablet Take 150 mg by mouth daily. 04/27/19   [provider]  hydrOXYzine (ATARAX/VISTARIL) 50 MG tablet Take 50-100 mg by mouth at bedtime as needed (for sleep).  [provider]  naltrexone (DEPADE) 50 MG tablet Take 50 mg by mouth daily. 04/27/19   [provider]  pantoprazole (PROTONIX) 40 MG tablet Take 1 tablet (40 mg total) by mouth 2 (two) times daily. 08/06/19   Azzie Glatter, FNP  promethazine (PHENERGAN) 25 MG tablet Take 1 tablet (25 mg total) by mouth every 6 (six) hours as needed for nausea or vomiting. Patient not taking: Reported on 08/06/2019 05/14/19   Jeanmarie Hubert, MD    Physical Exam: Vitals:   08/07/19 1918 08/07/19 1930 08/07/19 2000 08/08/19 0015  BP: (!) 149/101 124/83 110/82 123/84  Pulse: (!) 115 67 87   Resp: 18  16   Temp:      TempSrc:      SpO2: 100% 100% 99%   Weight:      Height:        Physical Exam  Constitutional: She is oriented to person, place, and time. She appears well-developed and well-nourished. No distress.  HENT:  Head: Normocephalic.  Eyes: Right eye exhibits no discharge. Left eye exhibits no discharge.  Cardiovascular: Normal rate, regular rhythm and intact distal pulses.  Pulmonary/Chest: Effort normal and breath sounds normal. No respiratory distress. She has no wheezes. She has no rales.  Abdominal: Soft. Bowel sounds are normal. She exhibits no distension. There is abdominal tenderness. There is no rebound and no guarding.  Right upper and lower quadrants tender to palpation  Musculoskeletal:        General: No edema.     Cervical back: Neck supple.  Neurological: She is alert and oriented to person, place, and time.  Skin: Skin is warm and dry. She is not diaphoretic.     Labs on Admission: I have personally reviewed following labs and imaging studies  CBC: Recent Labs  Lab 08/07/19 1737  WBC 16.6*  HGB 16.5*  HCT 47.0*  MCV 97.1  PLT Q000111Q   Basic Metabolic Panel: Recent Labs  Lab 08/07/19 1737  NA 135  K 3.1*  CL 98  CO2 12*  GLUCOSE 119*  BUN 12  CREATININE 1.23*  CALCIUM 10.0   GFR: Estimated Creatinine Clearance: 72 mL/min (A) (by C-G formula based on SCr of 1.23 mg/dL (H)). Liver Function Tests: Recent Labs  Lab 08/07/19 1737  AST 36  ALT 44  ALKPHOS 44  BILITOT 1.8*  PROT 8.6*  ALBUMIN 5.3*   Recent Labs  Lab 08/07/19 1737  LIPASE 24   No results for input(s): AMMONIA in the last 168 hours. Coagulation Profile: No results for input(s): INR, PROTIME in the last 168 hours. Cardiac Enzymes: No results for input(s): CKTOTAL, CKMB, CKMBINDEX, TROPONINI in  the last 168 hours. BNP (last 3 results) No results for input(s): PROBNP in the last 8760 hours. HbA1C: Recent Labs    08/06/19 1328  HGBA1C 4.5   CBG: No results for input(s): GLUCAP in the last 168 hours. Lipid Profile: No results for input(s): CHOL, HDL, LDLCALC, TRIG, CHOLHDL, LDLDIRECT in the last 72 hours. Thyroid Function Tests: No results for input(s): TSH, T4TOTAL, FREET4, T3FREE, THYROIDAB in the last 72 hours. Anemia Panel: No results for input(s): VITAMINB12, FOLATE, FERRITIN, TIBC, IRON, RETICCTPCT in the last 72 hours. Urine analysis:    Component Value Date/Time   COLORURINE YELLOW 08/07/2019 1753   APPEARANCEUR HAZY (A) 08/07/2019 1753   LABSPEC 1.026 08/07/2019 1753   PHURINE 5.0 08/07/2019 1753   GLUCOSEU NEGATIVE 08/07/2019 1753   HGBUR LARGE (A) 08/07/2019 1753  BILIRUBINUR NEGATIVE 08/07/2019 1753   BILIRUBINUR Negative 08/06/2019 1332   KETONESUR 80 (A) 08/07/2019 1753   PROTEINUR 100 (A) 08/07/2019 1753   UROBILINOGEN 0.2 08/06/2019 1332   NITRITE NEGATIVE 08/07/2019 1753   LEUKOCYTESUR NEGATIVE 08/07/2019 1753    Radiological Exams on Admission: CT ABDOMEN PELVIS W CONTRAST  Result Date: 08/07/2019 CLINICAL DATA:  29 year old female with abdominal pain. EXAM: CT ABDOMEN AND PELVIS WITH CONTRAST TECHNIQUE: Multidetector CT imaging of the abdomen and pelvis was performed using the standard protocol following bolus administration of intravenous contrast. CONTRAST:  159mL OMNIPAQUE IOHEXOL 300 MG/ML  SOLN COMPARISON:  CT abdomen pelvis dated 05/13/2019. FINDINGS: Lower chest: The visualized lung bases are clear. No intra-abdominal free air or free fluid. Hepatobiliary: Apparent fatty infiltration of the liver. No intrahepatic biliary ductal dilatation. The gallbladder is unremarkable. Pancreas: Unremarkable. No pancreatic ductal dilatation or surrounding inflammatory changes. Spleen: Normal in size without focal abnormality. Adrenals/Urinary Tract: The  adrenal glands are unremarkable. The kidneys, visualized ureters, and urinary bladder appear unremarkable as well. Stomach/Bowel: There is diffuse thickened appearance of the distal colon without surrounding inflammatory changes likely represents underdistention. Mild colitis is less likely. Clinical correlation is recommended. There is diffuse submucosal fat deposit along the distal colonic wall, likely sequela prior inflammation. There is no bowel obstruction. The appendix is normal. Vascular/Lymphatic: The abdominal aorta and IVC are unremarkable. No portal venous gas. There is no adenopathy. Reproductive: The uterus is anteverted and grossly unremarkable. The right ovary is unremarkable as well. There is a 15 mm dominant cyst or corpus luteum in the left ovary. A 2.5 cm probable paraovarian cyst as seen previously. Ultrasound may provide better evaluation of the pelvic structures if clinically indicated. Other: None Musculoskeletal: No acute or significant osseous findings. IMPRESSION: 1. Underdistention of the distal colon versus less likely mild colitis. Clinical correlation is recommended. No bowel obstruction. Normal appendix. 2. Left ovarian dominant follicle/corpus luteum as well as paraovarian cyst similar to prior CT. Electronically Signed   By: Anner Crete M.D.   On: 08/07/2019 21:41   US Abdomen Limited  Result Date: 08/07/2019 CLINICAL DATA:  29 year old female with right upper quadrant abdominal pain and vomiting. EXAM: ULTRASOUND ABDOMEN LIMITED RIGHT UPPER QUADRANT COMPARISON:  CT abdomen pelvis dated 05/13/2019. FINDINGS: Gallbladder: No gallstones or wall thickening visualized. No sonographic Murphy sign noted by sonographer. Common bile duct: Diameter: 4 mm. Liver: The liver is unremarkable. Portal vein is patent on color Doppler imaging with normal direction of blood flow towards the liver. Other: None. IMPRESSION: Unremarkable right upper quadrant ultrasound. Electronically Signed    By: Anner Crete M.D.   On: 08/07/2019 19:42    Assessment/Plan Principal Problem:   Intractable nausea and vomiting Active Problems:   Abdominal pain   Metabolic acidosis   Hypokalemia   AKI (acute kidney injury) ([place])   Right upper quadrant abdominal pain, intractable nausea and vomiting: Differentials include gastritis and cyclic vomiting secondary to marijuana use.  Admitted with a similar presentation back in December 2020.  Plan is to continue IV fluid hydration, IV PPI twice daily, IV morphine as needed for pain, IV antiemetic as needed for nausea/vomiting, and GI cocktail as needed.  Although less likely, COVID-19 viral gastroenteritis is also on the differential.  SARS-CoV-2 PCR test pending, continue airborne and contact precautions.  High anion gap metabolic acidosis: Bicarb 12, anion gap 25.  UA with ketones.  No history of diabetes or hyperglycemia to suggest DKA.  Check blood ethanol, salicylate, and  lactic acid level.  Start bicarb infusion.  Continue to monitor BMP.  Mild hypokalemia: Replete potassium.  Check magnesium level and replete if low.  Continue to monitor electrolytes.  Leukocytosis: Likely reactive.  No fever or signs of sepsis.  CT without evidence of acute infectious pathology.  UA not suggestive of infection.  Not endorsing any respiratory symptoms to suggest pneumonia and lungs clear on exam. Continue to monitor WBC count.  Mild AKI: Creatinine 1.2, baseline 0.7-0.9.  Suspect prerenal due to dehydration.  Continue IV fluid hydration.  Continue to monitor renal function and urine output.  DVT prophylaxis: Lovenox Code Status: Full code Family Communication: No family available at this time. Disposition Plan: Anticipate discharge in 1 to 2 days. Admission status: It is my clinical opinion that referral for OBSERVATION is reasonable and necessary in this patient based on the above information provided. The aforementioned taken together are felt to place the  patient at high risk for further clinical deterioration. However it is anticipated that the patient may be medically stable for discharge from the hospital within 24 to 48 hours.  The medical decision making on this patient was of high complexity and the patient is at high risk for clinical deterioration, therefore this is a level 3 visit.  Shela Leff MD [facility]  If 7PM-7AM, please contact night-coverage [email or web address]  08/08/2019, 1:25 AM

## 2019-08-08 NOTE — Progress Notes (Signed)
PROGRESS NOTE    Marissa Castillo  [id number] DOB: November 27, 1990 DOA: 08/07/2019 PCP: Patient, No Pcp Per    Brief Narrative:  29 y.o. female with medical history significant of anxiety, depression, colitis, gastritis, pancreatitis presenting with complaints of abdominal pain, nausea, and vomiting.  Patient reports 3-day history of right upper quadrant abdominal pain and intractable nausea and vomiting.  The pain radiates to her right shoulder blade.  She has not been able to tolerate p.o. intake.  No diarrhea.  She has been smoking marijuana to help with her symptoms.  States she had a sip of an alcoholic beverage at an event a few days ago and vomited soon after.  Since then, she has not consumed any alcohol.  No fevers, chills, cough, or shortness of breath.  ED Course: Afebrile.  Slightly tachycardic on arrival, resolved after IV fluid bolus.  Does have leukocytosis with WBC count 16.6.  Lipase normal.  T bili 1.8, remainder of LFTs normal.  Potassium 3.1.  Bicarb 12, anion gap 25.  Blood glucose 119.  BUN 12, creatinine 1.2.  Baseline creatinine 0.7-0.9.  Beta-hCG negative.  UA with ketones but not suggestive of infection.  UDS positive for THC.  SARS-CoV-2 PCR test pending.  CT abdomen pelvis showing underdistention of the distal colon versus less likely mild colitis.  No bowel obstruction.  Normal appendix.  There is evidence of fatty infiltration of the liver.  No intrahepatic biliary ductal dilatation and gallbladder is unremarkable.  Left ovarian dominant follicle/corpus luteum as well as paraovarian cyst similar to prior CT. Right upper quadrant ultrasound unremarkable. Patient received GI cocktail, Dilaudid, Toradol, Reglan, Protonix, potassium supplementation, Phenergan, and 2 L normal saline boluses.  Assessment & Plan:   Principal Problem:   Intractable nausea and vomiting Active Problems:   Abdominal pain   Metabolic acidosis   Hypokalemia   AKI (acute kidney injury) ([place])      Right upper quadrant abdominal pain, intractable nausea and vomiting:   Admitted with a similar presentation back in December 2020.   Plan is to continue IV fluid hydration, IV PPI twice daily, IV morphine as needed for pain, IV antiemetic as needed for nausea/vomiting, and GI cocktail as needed.   CT abd personally reviewed, unremarkable, as is RUQ [place], reviewed Little relief with toradol Will give trial of compazine and muscle relaxant Today, still with significant nausea, not improved with IV zofran. Not tolerating much, if any, PO intake as a result  High anion gap metabolic acidosis:  Bicarb 12, anion gap 25.  UA with ketones.  No history of diabetes or hyperglycemia to suggest DKA.  Repeat bmet in AM  Mild hypokalemia: Remains low. Will replace. Repeat bmet in AM  Leukocytosis: Likely reactive.  No fever or signs of sepsis.  CT without evidence of acute infectious pathology.  UA not suggestive of infection.  Not endorsing any respiratory symptoms to suggest pneumonia and lungs clear on exam. Repeat CBC in AM  Mild AKI: Creatinine 1.2, baseline 0.7-0.9.  Likely prerenal due to dehydration.  Continue IV fluid hydration.  Continue to monitor renal function and urine output.  DVT prophylaxis: Lovenox subq Code Status: Full Family Communication: Pt in room, family at bedside Disposition Plan: From home, plan d/c home when able to tolerate PO and abd pain improves  Consultants:     Procedures:     Antimicrobials: Anti-infectives (From admission, onward)   None       Subjective: Still feeling nauseated with complaints of abd  pain not improved with current analgesics or toradol  Objective: Vitals:   08/08/19 0015 08/08/19 0157 08/08/19 0418 08/08/19 1502  BP: 123/84 122/79 115/65 115/82  Pulse:  72 81 94  Resp:  20 18 18   Temp:  99 F (37.2 C) 98.5 F (36.9 C) 98.4 F (36.9 C)  TempSrc:  Oral Oral Oral  SpO2:  98% 99% 99%  Weight:  80.5 kg    Height:  5\' 6"   (1.676 m)      Intake/Output Summary (Last 24 hours) at 08/08/2019 1708 Last data filed at 08/08/2019 0600 Gross per 24 hour  Intake 1325.34 ml  Output --  Net 1325.34 ml   Filed Weights   08/07/19 1724 08/08/19 0157  Weight: 81.6 kg 80.5 kg    Examination:  General exam: Appears calm and comfortable  Respiratory system: Clear to auscultation. Respiratory effort normal. Cardiovascular system: S1 & S2 heard, Regular Gastrointestinal system: Abdomen is nondistended, soft and nontender. No organomegaly or masses felt. Normal bowel sounds heard. Central nervous system: Alert and oriented. No focal neurological deficits. Extremities: Symmetric 5 x 5 power. Skin: No rashes, lesions Psychiatry: Judgement and insight appear normal. Appears anxious   Data Reviewed: I have personally reviewed following labs and imaging studies  CBC: Recent Labs  Lab 08/07/19 1737  WBC 16.6*  HGB 16.5*  HCT 47.0*  MCV 97.1  PLT Q000111Q   Basic Metabolic Panel: Recent Labs  Lab 08/07/19 1737 08/08/19 0244  NA 135 134*  K 3.1* 3.1*  CL 98 102  CO2 12* 19*  GLUCOSE 119* 139*  BUN 12 10  CREATININE 1.23* 0.87  CALCIUM 10.0 8.6*  MG  --  1.8   GFR: Estimated Creatinine Clearance: 101.2 mL/min (by C-G formula based on SCr of 0.87 mg/dL). Liver Function Tests: Recent Labs  Lab 08/07/19 1737 08/08/19 0244  AST 36 24  ALT 44 32  ALKPHOS 44 33*  BILITOT 1.8* 1.1  PROT 8.6* 6.5  ALBUMIN 5.3* 4.0   Recent Labs  Lab 08/07/19 1737  LIPASE 24   No results for input(s): AMMONIA in the last 168 hours. Coagulation Profile: No results for input(s): INR, PROTIME in the last 168 hours. Cardiac Enzymes: No results for input(s): CKTOTAL, CKMB, CKMBINDEX, TROPONINI in the last 168 hours. BNP (last 3 results) No results for input(s): PROBNP in the last 8760 hours. HbA1C: Recent Labs    08/06/19 1328  HGBA1C 4.5   CBG: No results for input(s): GLUCAP in the last 168 hours. Lipid  Profile: No results for input(s): CHOL, HDL, LDLCALC, TRIG, CHOLHDL, LDLDIRECT in the last 72 hours. Thyroid Function Tests: No results for input(s): TSH, T4TOTAL, FREET4, T3FREE, THYROIDAB in the last 72 hours. Anemia Panel: No results for input(s): VITAMINB12, FOLATE, FERRITIN, TIBC, IRON, RETICCTPCT in the last 72 hours. Sepsis Labs: Recent Labs  Lab 08/08/19 0244  LATICACIDVEN 0.9    Recent Results (from the past 240 hour(s))  SARS CORONAVIRUS 2 (TAT 6-24 HRS) Nasopharyngeal Nasopharyngeal Swab     Status: None   Collection Time: 08/08/19 12:15 AM   Specimen: Nasopharyngeal Swab  Result Value Ref Range Status   SARS Coronavirus 2 NEGATIVE NEGATIVE Final    Comment: (NOTE) SARS-CoV-2 target nucleic acids are NOT DETECTED. The SARS-CoV-2 RNA is generally detectable in upper and lower respiratory specimens during the acute phase of infection. Negative results do not preclude SARS-CoV-2 infection, do not rule out co-infections with other pathogens, and should not be used as the sole basis  for treatment or other patient management decisions. Negative results must be combined with clinical observations, patient history, and epidemiological information. The expected result is Negative. Fact Sheet for Patients: SugarRoll.be Fact Sheet for Healthcare Providers: https://www.woods-mathews.com/ This test is not yet approved or cleared by the [place] FDA and  has been authorized for detection and/or diagnosis of SARS-CoV-2 by FDA under an Emergency Use Authorization (EUA). This EUA will remain  in effect (meaning this test can be used) for the duration of the COVID-19 declaration under Section 56 4(b)(1) of the Act, 21 U.S.C. section 360bbb-3(b)(1), unless the authorization is terminated or revoked sooner. Performed at [facility], [place] [address] [place], [place] [address]      Radiology Studies: CT ABDOMEN PELVIS W  CONTRAST  Result Date: 08/07/2019 CLINICAL DATA:  29 year old female with abdominal pain. EXAM: CT ABDOMEN AND PELVIS WITH CONTRAST TECHNIQUE: Multidetector CT imaging of the abdomen and pelvis was performed using the standard protocol following bolus administration of intravenous contrast. CONTRAST:  155mL OMNIPAQUE IOHEXOL 300 MG/ML  SOLN COMPARISON:  CT abdomen pelvis dated 05/13/2019. FINDINGS: Lower chest: The visualized lung bases are clear. No intra-abdominal free air or free fluid. Hepatobiliary: Apparent fatty infiltration of the liver. No intrahepatic biliary ductal dilatation. The gallbladder is unremarkable. Pancreas: Unremarkable. No pancreatic ductal dilatation or surrounding inflammatory changes. Spleen: Normal in size without focal abnormality. Adrenals/Urinary Tract: The adrenal glands are unremarkable. The kidneys, visualized ureters, and urinary bladder appear unremarkable as well. Stomach/Bowel: There is diffuse thickened appearance of the distal colon without surrounding inflammatory changes likely represents underdistention. Mild colitis is less likely. Clinical correlation is recommended. There is diffuse submucosal fat deposit along the distal colonic wall, likely sequela prior inflammation. There is no bowel obstruction. The appendix is normal. Vascular/Lymphatic: The abdominal aorta and IVC are unremarkable. No portal venous gas. There is no adenopathy. Reproductive: The uterus is anteverted and grossly unremarkable. The right ovary is unremarkable as well. There is a 15 mm dominant cyst or corpus luteum in the left ovary. A 2.5 cm probable paraovarian cyst as seen previously. Ultrasound may provide better evaluation of the pelvic structures if clinically indicated. Other: None Musculoskeletal: No acute or significant osseous findings. IMPRESSION: 1. Underdistention of the distal colon versus less likely mild colitis. Clinical correlation is recommended. No bowel obstruction. Normal  appendix. 2. Left ovarian dominant follicle/corpus luteum as well as paraovarian cyst similar to prior CT. Electronically Signed   By: Anner Crete M.D.   On: 08/07/2019 21:41   US Abdomen Limited  Result Date: 08/07/2019 CLINICAL DATA:  29 year old female with right upper quadrant abdominal pain and vomiting. EXAM: ULTRASOUND ABDOMEN LIMITED RIGHT UPPER QUADRANT COMPARISON:  CT abdomen pelvis dated 05/13/2019. FINDINGS: Gallbladder: No gallstones or wall thickening visualized. No sonographic Murphy sign noted by sonographer. Common bile duct: Diameter: 4 mm. Liver: The liver is unremarkable. Portal vein is patent on color Doppler imaging with normal direction of blood flow towards the liver. Other: None. IMPRESSION: Unremarkable right upper quadrant ultrasound. Electronically Signed   By: Anner Crete M.D.   On: 08/07/2019 19:42    Scheduled Meds: . buPROPion  150 mg Oral Daily  . enoxaparin (LOVENOX) injection  40 mg Subcutaneous Q24H  . pantoprazole (PROTONIX) IV  40 mg Intravenous Q12H   Continuous Infusions: .  sodium bicarbonate (isotonic) infusion in sterile water 125 mL/hr at 08/08/19 1109     LOS: 0 days   Marylu Lund, MD [facility] Pager On Amion  If 7PM-7AM, please contact night-coverage 08/08/2019, 5:08 PM

## 2019-08-08 NOTE — Plan of Care (Signed)
  Problem: Education: Goal: Knowledge of General Education information will improve Description: Including pain rating scale, medication(s)/side effects and non-pharmacologic comfort measures Outcome: Progressing   Problem: Health Behavior/Discharge Planning: Goal: Ability to manage health-related needs will improve Outcome: Progressing   Problem: Clinical Measurements: Goal: Ability to maintain clinical measurements within normal limits will improve Outcome: Progressing Goal: Respiratory complications will improve Outcome: Progressing   Problem: Pain Managment: Goal: General experience of comfort will improve Outcome: Progressing   Problem: Safety: Goal: Ability to remain free from injury will improve Outcome: Progressing   Problem: Skin Integrity: Goal: Risk for impaired skin integrity will decrease Outcome: Progressing   

## 2019-08-09 ENCOUNTER — Encounter (HOSPITAL_COMMUNITY): Payer: Self-pay | Admitting: Internal Medicine

## 2019-08-09 DIAGNOSIS — E876 Hypokalemia: Secondary | ICD-10-CM

## 2019-08-09 DIAGNOSIS — R112 Nausea with vomiting, unspecified: Secondary | ICD-10-CM

## 2019-08-09 HISTORY — DX: Nausea with vomiting, unspecified: R11.2

## 2019-08-09 LAB — CBC
HCT: 37.8 % (ref 36.0–46.0)
Hemoglobin: 13.7 g/dL (ref 12.0–15.0)
MCH: 34.3 pg — ABNORMAL HIGH (ref 26.0–34.0)
MCHC: 36.2 g/dL — ABNORMAL HIGH (ref 30.0–36.0)
MCV: 94.7 fL (ref 80.0–100.0)
Platelets: 197 10*3/uL (ref 150–400)
RBC: 3.99 MIL/uL (ref 3.87–5.11)
RDW: 11 % — ABNORMAL LOW (ref 11.5–15.5)
WBC: 6.7 10*3/uL (ref 4.0–10.5)
nRBC: 0 % (ref 0.0–0.2)

## 2019-08-09 LAB — COMPREHENSIVE METABOLIC PANEL WITH GFR
ALT: 26 U/L (ref 0–44)
AST: 23 U/L (ref 15–41)
Albumin: 3.3 g/dL — ABNORMAL LOW (ref 3.5–5.0)
Alkaline Phosphatase: 33 U/L — ABNORMAL LOW (ref 38–126)
Anion gap: 12 (ref 5–15)
BUN: <5 mg/dL — ABNORMAL LOW (ref 6–20)
CO2: 31 mmol/L (ref 22–32)
Calcium: 8.4 mg/dL — ABNORMAL LOW (ref 8.9–10.3)
Chloride: 95 mmol/L — ABNORMAL LOW (ref 98–111)
Creatinine, Ser: 0.63 mg/dL (ref 0.44–1.00)
GFR calc Af Amer: >60 mL/min (ref >60)
GFR calc non Af Amer: >60 mL/min (ref >60)
Glucose, Bld: 97 mg/dL (ref 70–99)
Potassium: 2.4 mmol/L — CL (ref 3.5–5.1)
Sodium: 138 mmol/L (ref 135–145)
Total Bilirubin: 1.4 mg/dL — ABNORMAL HIGH (ref 0.3–1.2)
Total Protein: 5.7 g/dL — ABNORMAL LOW (ref 6.5–8.1)

## 2019-08-09 LAB — TSH: TSH: 2.777 u[IU]/mL (ref 0.350–4.500)

## 2019-08-09 MED ORDER — POTASSIUM CHLORIDE CRYS ER 20 MEQ PO TBCR: 40.0000 meq | EXTENDED_RELEASE_TABLET | Freq: Two times a day (BID) | ORAL | Status: DC

## 2019-08-09 MED ORDER — POTASSIUM CHLORIDE 10 MEQ/100ML IV SOLN
10.0000 meq | INTRAVENOUS | Status: AC
  Administered 2019-08-09 (×5): 10 meq via INTRAVENOUS
  Filled 2019-08-09: qty 100

## 2019-08-09 NOTE — Progress Notes (Signed)
PROGRESS NOTE    Marissa Castillo  [id number] DOB: Jan 11, 1991 DOA: 08/07/2019 PCP: Patient, No Pcp Per    Brief Narrative:  29 y.o. female with medical history significant of anxiety, depression, colitis, gastritis, pancreatitis presenting with complaints of abdominal pain, nausea, and vomiting.  Patient reports 3-day history of right upper quadrant abdominal pain and intractable nausea and vomiting.  The pain radiates to her right shoulder blade.  She has not been able to tolerate p.o. intake.  No diarrhea.  She has been smoking marijuana to help with her symptoms.  States she had a sip of an alcoholic beverage at an event a few days ago and vomited soon after.  Since then, she has not consumed any alcohol.  No fevers, chills, cough, or shortness of breath.  ED Course: Afebrile.  Slightly tachycardic on arrival, resolved after IV fluid bolus.  Does have leukocytosis with WBC count 16.6.  Lipase normal.  T bili 1.8, remainder of LFTs normal.  Potassium 3.1.  Bicarb 12, anion gap 25.  Blood glucose 119.  BUN 12, creatinine 1.2.  Baseline creatinine 0.7-0.9.  Beta-hCG negative.  UA with ketones but not suggestive of infection.  UDS positive for THC.  SARS-CoV-2 PCR test pending.  CT abdomen pelvis showing underdistention of the distal colon versus less likely mild colitis.  No bowel obstruction.  Normal appendix.  There is evidence of fatty infiltration of the liver.  No intrahepatic biliary ductal dilatation and gallbladder is unremarkable.  Left ovarian dominant follicle/corpus luteum as well as paraovarian cyst similar to prior CT. Right upper quadrant ultrasound unremarkable. Patient received GI cocktail, Dilaudid, Toradol, Reglan, Protonix, potassium supplementation, Phenergan, and 2 L normal saline boluses.  Assessment & Plan:   Principal Problem:   Intractable nausea and vomiting Active Problems:   Abdominal pain   Metabolic acidosis   Hypokalemia   AKI (acute kidney injury) ([place])      Right upper quadrant abdominal pain, intractable nausea and vomiting:   Admitted with a similar presentation back in December 2020.   Plan is to continue IV fluid hydration, IV PPI twice daily, IV morphine as needed for pain, IV antiemetic as needed for nausea/vomiting, and GI cocktail as needed.   CT abd personally reviewed, unremarkable, as is RUQ [place], reviewed Little relief with toradol Will give trial of compazine and muscle relaxant Clinically improving however, pt still requiring multiple anti-emetics for nausea, unable to tolerate PO potassium this AM Advancing diet as tolerated  High anion gap metabolic acidosis:  Bicarb 12, anion gap 25.  UA with ketones.  No history of diabetes or hyperglycemia to suggest DKA.  Recheck bmet in AM  Mild hypokalemia: Remains low this AM. Not tolerating oral potassium given above. Will replace with IV potassium. Recheck bmet in AM  Leukocytosis: Likely reactive.  No fever or signs of sepsis.  CT without evidence of acute infectious pathology.  UA not suggestive of infection.  Not endorsing any respiratory symptoms to suggest pneumonia and lungs clear on exam. Cont to follow CBC trends  Mild AKI: Creatinine 1.2, baseline 0.7-0.9.  Likely prerenal due to dehydration.  Continue IV fluid hydration.  Continue to monitor renal function and urine output.  DVT prophylaxis: Lovenox subq Code Status: Full Family Communication: Pt in room, family at bedside Disposition Plan: From home, plan d/c home when able to tolerate PO and abd pain resolves  Consultants:     Procedures:     Antimicrobials: Anti-infectives (From admission, onward)   None  Subjective: Feeling better, however still requiring IV anti-emetic for continued nausea  Objective: Vitals:   08/08/19 0418 08/08/19 1502 08/08/19 2109 08/09/19 0431  BP: 115/65 115/82 (!) 131/92 97/62  Pulse: 81 94 76 61  Resp: 18 18 18 20   Temp: 98.5 F (36.9 C) 98.4 F (36.9 C) 99 F  (37.2 C) 98.3 F (36.8 C)  TempSrc: Oral Oral Oral Oral  SpO2: 99% 99% 100% 98%  Weight:      Height:        Intake/Output Summary (Last 24 hours) at 08/09/2019 1744 Last data filed at 08/09/2019 0500 Gross per 24 hour  Intake 1401.25 ml  Output --  Net 1401.25 ml   Filed Weights   08/07/19 1724 08/08/19 0157  Weight: 81.6 kg 80.5 kg    Examination: General exam: Awake, laying in bed, in nad Respiratory system: Normal resp effort, no audible wheezing Cardiovascular system: regular rate, s1, s2 Gastrointestinal system: Soft, nondistended, positive BS Central nervous system: CN2-12 grossly intact, strength intact Extremities: Perfused, no clubbing Skin: Normal skin turgor, no notable skin lesions seen Psychiatry: Mood normal // no visual hallucinations   Data Reviewed: I have personally reviewed following labs and imaging studies  CBC: Recent Labs  Lab 08/07/19 1737 08/09/19 0354  WBC 16.6* 6.7  HGB 16.5* 13.7  HCT 47.0* 37.8  MCV 97.1 94.7  PLT 329 [id number]   Basic Metabolic Panel: Recent Labs  Lab 08/07/19 1737 08/08/19 0244 08/09/19 0354  NA 135 134* 138  K 3.1* 3.1* 2.4*  CL 98 102 95*  CO2 12* 19* 31  GLUCOSE 119* 139* 97  BUN 12 10 <5*  CREATININE 1.23* 0.87 0.63  CALCIUM 10.0 8.6* 8.4*  MG  --  1.8  --    GFR: Estimated Creatinine Clearance: 110.1 mL/min (by C-G formula based on SCr of 0.63 mg/dL). Liver Function Tests: Recent Labs  Lab 08/07/19 1737 08/08/19 0244 08/09/19 0354  AST 36 24 23  ALT 44 32 26  ALKPHOS 44 33* 33*  BILITOT 1.8* 1.1 1.4*  PROT 8.6* 6.5 5.7*  ALBUMIN 5.3* 4.0 3.3*   Recent Labs  Lab 08/07/19 1737  LIPASE 24   No results for input(s): AMMONIA in the last 168 hours. Coagulation Profile: No results for input(s): INR, PROTIME in the last 168 hours. Cardiac Enzymes: No results for input(s): CKTOTAL, CKMB, CKMBINDEX, TROPONINI in the last 168 hours. BNP (last 3 results) No results for input(s): PROBNP in the last  8760 hours. HbA1C: No results for input(s): HGBA1C in the last 72 hours. CBG: No results for input(s): GLUCAP in the last 168 hours. Lipid Profile: No results for input(s): CHOL, HDL, LDLCALC, TRIG, CHOLHDL, LDLDIRECT in the last 72 hours. Thyroid Function Tests: Recent Labs    08/09/19 0354  TSH 2.777   Anemia Panel: No results for input(s): VITAMINB12, FOLATE, FERRITIN, TIBC, IRON, RETICCTPCT in the last 72 hours. Sepsis Labs: Recent Labs  Lab 08/08/19 0244  LATICACIDVEN 0.9    Recent Results (from the past 240 hour(s))  SARS CORONAVIRUS 2 (TAT 6-24 HRS) Nasopharyngeal Nasopharyngeal Swab     Status: None   Collection Time: 08/08/19 12:15 AM   Specimen: Nasopharyngeal Swab  Result Value Ref Range Status   SARS Coronavirus 2 NEGATIVE NEGATIVE Final    Comment: (NOTE) SARS-CoV-2 target nucleic acids are NOT DETECTED. The SARS-CoV-2 RNA is generally detectable in upper and lower respiratory specimens during the acute phase of infection. Negative results do not preclude SARS-CoV-2 infection, do not  rule out co-infections with other pathogens, and should not be used as the sole basis for treatment or other patient management decisions. Negative results must be combined with clinical observations, patient history, and epidemiological information. The expected result is Negative. Fact Sheet for Patients: SugarRoll.be Fact Sheet for Healthcare Providers: https://www.woods-mathews.com/ This test is not yet approved or cleared by the [place] FDA and  has been authorized for detection and/or diagnosis of SARS-CoV-2 by FDA under an Emergency Use Authorization (EUA). This EUA will remain  in effect (meaning this test can be used) for the duration of the COVID-19 declaration under Section 56 4(b)(1) of the Act, 21 U.S.C. section 360bbb-3(b)(1), unless the authorization is terminated or revoked sooner. Performed at [facility], [place] [address], [place], [place] [address]      Radiology Studies: CT ABDOMEN PELVIS W CONTRAST  Result Date: 08/07/2019 CLINICAL DATA:  29 year old female with abdominal pain. EXAM: CT ABDOMEN AND PELVIS WITH CONTRAST TECHNIQUE: Multidetector CT imaging of the abdomen and pelvis was performed using the standard protocol following bolus administration of intravenous contrast. CONTRAST:  149mL OMNIPAQUE IOHEXOL 300 MG/ML  SOLN COMPARISON:  CT abdomen pelvis dated 05/13/2019. FINDINGS: Lower chest: The visualized lung bases are clear. No intra-abdominal free air or free fluid. Hepatobiliary: Apparent fatty infiltration of the liver. No intrahepatic biliary ductal dilatation. The gallbladder is unremarkable. Pancreas: Unremarkable. No pancreatic ductal dilatation or surrounding inflammatory changes. Spleen: Normal in size without focal abnormality. Adrenals/Urinary Tract: The adrenal glands are unremarkable. The kidneys, visualized ureters, and urinary bladder appear unremarkable as well. Stomach/Bowel: There is diffuse thickened appearance of the distal colon without surrounding inflammatory changes likely represents underdistention. Mild colitis is less likely. Clinical correlation is recommended. There is diffuse submucosal fat deposit along the distal colonic wall, likely sequela prior inflammation. There is no bowel obstruction. The appendix is normal. Vascular/Lymphatic: The abdominal aorta and IVC are unremarkable. No portal venous gas. There is no adenopathy. Reproductive: The uterus is anteverted and grossly unremarkable. The right ovary is unremarkable as well. There is a 15 mm dominant cyst or corpus luteum in the left ovary. A 2.5 cm probable paraovarian cyst as seen previously. Ultrasound may provide better evaluation of the pelvic structures if clinically indicated. Other: None Musculoskeletal: No acute or significant osseous findings. IMPRESSION: 1. Underdistention of the distal colon versus less  likely mild colitis. Clinical correlation is recommended. No bowel obstruction. Normal appendix. 2. Left ovarian dominant follicle/corpus luteum as well as paraovarian cyst similar to prior CT. Electronically Signed   By: Anner Crete M.D.   On: 08/07/2019 21:41   US Abdomen Limited  Result Date: 08/07/2019 CLINICAL DATA:  29 year old female with right upper quadrant abdominal pain and vomiting. EXAM: ULTRASOUND ABDOMEN LIMITED RIGHT UPPER QUADRANT COMPARISON:  CT abdomen pelvis dated 05/13/2019. FINDINGS: Gallbladder: No gallstones or wall thickening visualized. No sonographic Murphy sign noted by sonographer. Common bile duct: Diameter: 4 mm. Liver: The liver is unremarkable. Portal vein is patent on color Doppler imaging with normal direction of blood flow towards the liver. Other: None. IMPRESSION: Unremarkable right upper quadrant ultrasound. Electronically Signed   By: Anner Crete M.D.   On: 08/07/2019 19:42    Scheduled Meds: . buPROPion  150 mg Oral Daily  . enoxaparin (LOVENOX) injection  40 mg Subcutaneous Q24H  . pantoprazole (PROTONIX) IV  40 mg Intravenous Q12H   Continuous Infusions:    LOS: 1 day   Marylu Lund, MD [facility] Pager On Amion  If 7PM-7AM, please contact night-coverage 08/09/2019, 5:44 PM

## 2019-08-09 NOTE — Plan of Care (Signed)
  Problem: Education: Goal: Knowledge of General Education information will improve Description Including pain rating scale, medication(s)/side effects and non-pharmacologic comfort measures Outcome: Progressing   

## 2019-08-09 NOTE — Progress Notes (Signed)
Pt refused to take potassium pill at this time. Per patient, can not take any big pills, she throws them up. Pt requesting to have IV potassium instead. Notified MD

## 2019-08-10 ENCOUNTER — Encounter: Payer: Self-pay | Admitting: Gastroenterology

## 2019-08-10 LAB — COMPREHENSIVE METABOLIC PANEL
ALT: 29 U/L (ref 0–44)
AST: 33 U/L (ref 15–41)
Albumin: 3.4 g/dL — ABNORMAL LOW (ref 3.5–5.0)
Alkaline Phosphatase: 34 U/L — ABNORMAL LOW (ref 38–126)
Anion gap: 9 (ref 5–15)
BUN: <5 mg/dL — ABNORMAL LOW (ref 6–20)
CO2: 28 mmol/L (ref 22–32)
Calcium: 9.1 mg/dL (ref 8.9–10.3)
Chloride: 102 mmol/L (ref 98–111)
Creatinine, Ser: 0.63 mg/dL (ref 0.44–1.00)
GFR calc Af Amer: >60 mL/min (ref >60)
GFR calc non Af Amer: >60 mL/min (ref >60)
Glucose, Bld: 104 mg/dL — ABNORMAL HIGH (ref 70–99)
Potassium: 2.9 mmol/L — ABNORMAL LOW (ref 3.5–5.1)
Sodium: 139 mmol/L (ref 135–145)
Total Bilirubin: 1 mg/dL (ref 0.3–1.2)
Total Protein: 5.7 g/dL — ABNORMAL LOW (ref 6.5–8.1)

## 2019-08-10 LAB — POTASSIUM: Potassium: 3.3 mmol/L — ABNORMAL LOW (ref 3.5–5.1)

## 2019-08-10 MED ORDER — POTASSIUM CHLORIDE 20 MEQ/15ML (10%) PO SOLN
40.0000 meq | Freq: Once | ORAL | Status: DC
  Filled 2019-08-10: qty 30

## 2019-08-10 MED ORDER — POTASSIUM CHLORIDE 20 MEQ/15ML (10%) PO SOLN
40.0000 meq | Freq: Two times a day (BID) | ORAL | Status: AC
  Administered 2019-08-10 (×2): 40 meq via ORAL
  Filled 2019-08-10: qty 30

## 2019-08-10 MED ORDER — BISMUTH SUBSALICYLATE 262 MG PO CHEW
524.0000 mg | CHEWABLE_TABLET | ORAL | Status: DC | PRN
  Administered 2019-08-10: 524 mg via ORAL
  Filled 2019-08-10 (×4): qty 2

## 2019-08-10 MED ORDER — PANTOPRAZOLE SODIUM 40 MG PO TBEC
40.0000 mg | DELAYED_RELEASE_TABLET | Freq: Two times a day (BID) | ORAL | Status: DC
  Administered 2019-08-10 – 2019-08-11 (×3): 40 mg via ORAL
  Filled 2019-08-10 (×3): qty 1

## 2019-08-10 MED ORDER — POTASSIUM CHLORIDE 10 MEQ/100ML IV SOLN
10.0000 meq | INTRAVENOUS | Status: AC
  Administered 2019-08-10 – 2019-08-11 (×3): 10 meq via INTRAVENOUS
  Filled 2019-08-10 (×4): qty 100

## 2019-08-10 NOTE — Progress Notes (Signed)
Home medication noted on patients overbed table. Patient reports she  was asked to bring in for verification/reconciliation. Patient reminded to not take home meds. Encouraged to send home with visitor/family. Patient agreed.

## 2019-08-10 NOTE — Telephone Encounter (Signed)
Ok, schedule next available appointment. If she needs to be seen sooner, please check if any of the other provides have earlier availability. Thank you

## 2019-08-10 NOTE — Telephone Encounter (Signed)
DOD 08/10/19 Dr. Silverio Decamp, please see below.  Pt has [facility] financial assistance and can only be seen at [facility].  She was seen in the ED again 08/07/19 and requests to follow up with GI.  Please review records available in Epic and consider seeing the pt.

## 2019-08-10 NOTE — Progress Notes (Signed)
PROGRESS NOTE    Cornelious Jeremiah  [id number] DOB: [birth year] DOA: 08/07/2019 PCP: Patient, No Pcp Per    Brief Narrative:  29 y.o. female with medical history significant of anxiety, depression, colitis, gastritis, pancreatitis presenting with complaints of abdominal pain, nausea, and vomiting.  Patient reports 3-day history of right upper quadrant abdominal pain and intractable nausea and vomiting.  The pain radiates to her right shoulder blade.  She has not been able to tolerate p.o. intake.  No diarrhea.  She has been smoking marijuana to help with her symptoms.  States she had a sip of an alcoholic beverage at an event a few days ago and vomited soon after.  Since then, she has not consumed any alcohol.  No fevers, chills, cough, or shortness of breath.  ED Course: Afebrile.  Slightly tachycardic on arrival, resolved after IV fluid bolus.  Does have leukocytosis with WBC count 16.6.  Lipase normal.  T bili 1.8, remainder of LFTs normal.  Potassium 3.1.  Bicarb 12, anion gap 25.  Blood glucose 119.  BUN 12, creatinine 1.2.  Baseline creatinine 0.7-0.9.  Beta-hCG negative.  UA with ketones but not suggestive of infection.  UDS positive for THC.  SARS-CoV-2 PCR test pending.  CT abdomen pelvis showing underdistention of the distal colon versus less likely mild colitis.  No bowel obstruction.  Normal appendix.  There is evidence of fatty infiltration of the liver.  No intrahepatic biliary ductal dilatation and gallbladder is unremarkable.  Left ovarian dominant follicle/corpus luteum as well as paraovarian cyst similar to prior CT. Right upper quadrant ultrasound unremarkable. Patient received GI cocktail, Dilaudid, Toradol, Reglan, Protonix, potassium supplementation, Phenergan, and 2 L normal saline boluses.  Assessment & Plan:   Principal Problem:   Intractable nausea and vomiting Active Problems:   Abdominal pain   Metabolic acidosis   Hypokalemia   AKI (acute kidney injury) ([place])      Right upper quadrant abdominal pain, intractable nausea and vomiting:   Admitted with a similar presentation back in December 2020.   Plan is to continue IV fluid hydration, IV PPI twice daily, IV morphine as needed for pain, IV antiemetic as needed for nausea/vomiting, and GI cocktail as needed.   CT abd personally reviewed, unremarkable, as is RUQ [place], reviewed Little relief with toradol Will give trial of compazine and muscle relaxant Overall improved, however, pt still nauseated and vomited potassium this afternoon, still requiring IV anti-emetic  High anion gap metabolic acidosis:  Bicarb 12, anion gap 25.  UA with ketones.  No history of diabetes or hyperglycemia to suggest DKA.  Repeat bmet in AM  Mild hypokalemia: Remains low this AM. Continue to replace, repeat bmet in AM  Leukocytosis: Likely reactive.  No fever or signs of sepsis.  CT without evidence of acute infectious pathology.  UA not suggestive of infection.  Not endorsing any respiratory symptoms to suggest pneumonia and lungs clear on exam. Cont to follow CBC trends  Mild AKI: Creatinine 1.2 at presentation, baseline 0.7-0.9.  Likely prerenal due to dehydration.  Continue IV fluid hydration.  repeat bmet in AM  DVT prophylaxis: Lovenox subq Code Status: Full Family Communication: Pt in room, family at bedside Disposition Plan: From home, plan d/c home when able to tolerate PO and abd pain resolves  Consultants:     Procedures:     Antimicrobials: Anti-infectives (From admission, onward)   None      Subjective: Overall feeling better, however still nauseated and requiring IV antiemetics  Objective: Vitals:   08/09/19 0431 08/09/19 2100 08/10/19 0629 08/10/19 1402  BP: 97/62 (!) 133/96 112/74 (!) 134/96  Pulse: 61 99 73 75  Resp: 20 16 18 19   Temp: 98.3 F (36.8 C) 98.8 F (37.1 C) (!) 97.5 F (36.4 C) 97.7 F (36.5 C)  TempSrc: Oral Axillary Oral Oral  SpO2: 98% 100% 96% 100%  Weight:       Height:        Intake/Output Summary (Last 24 hours) at 08/10/2019 1808 Last data filed at 08/10/2019 1330 Gross per 24 hour  Intake 600 ml  Output 150 ml  Net 450 ml   Filed Weights   08/07/19 1724 08/08/19 0157  Weight: 81.6 kg 80.5 kg    Examination: General exam: Conversant, in no acute distress Respiratory system: normal chest rise, clear, no audible wheezing Cardiovascular system: regular rhythm, s1-s2 Gastrointestinal system: Nondistended, nontender, pos BS Central nervous system: No seizures, no tremors Extremities: No cyanosis, no joint deformities Skin: No rashes, no pallor Psychiatry: Affect normal // no auditory hallucinations   Data Reviewed: I have personally reviewed following labs and imaging studies  CBC: Recent Labs  Lab 08/07/19 1737 08/09/19 0354  WBC 16.6* 6.7  HGB 16.5* 13.7  HCT 47.0* 37.8  MCV 97.1 94.7  PLT 329 [id number]   Basic Metabolic Panel: Recent Labs  Lab 08/07/19 1737 08/08/19 0244 08/09/19 0354 08/10/19 0801  NA 135 134* 138 139  K 3.1* 3.1* 2.4* 2.9*  CL 98 102 95* 102  CO2 12* 19* 31 28  GLUCOSE 119* 139* 97 104*  BUN 12 10 <5* <5*  CREATININE 1.23* 0.87 0.63 0.63  CALCIUM 10.0 8.6* 8.4* 9.1  MG  --  1.8  --   --    GFR: Estimated Creatinine Clearance: 110.1 mL/min (by C-G formula based on SCr of 0.63 mg/dL). Liver Function Tests: Recent Labs  Lab 08/07/19 1737 08/08/19 0244 08/09/19 0354 08/10/19 0801  AST 36 24 23 33  ALT 44 32 26 29  ALKPHOS 44 33* 33* 34*  BILITOT 1.8* 1.1 1.4* 1.0  PROT 8.6* 6.5 5.7* 5.7*  ALBUMIN 5.3* 4.0 3.3* 3.4*   Recent Labs  Lab 08/07/19 1737  LIPASE 24   No results for input(s): AMMONIA in the last 168 hours. Coagulation Profile: No results for input(s): INR, PROTIME in the last 168 hours. Cardiac Enzymes: No results for input(s): CKTOTAL, CKMB, CKMBINDEX, TROPONINI in the last 168 hours. BNP (last 3 results) No results for input(s): PROBNP in the last 8760 hours. HbA1C: No  results for input(s): HGBA1C in the last 72 hours. CBG: No results for input(s): GLUCAP in the last 168 hours. Lipid Profile: No results for input(s): CHOL, HDL, LDLCALC, TRIG, CHOLHDL, LDLDIRECT in the last 72 hours. Thyroid Function Tests: Recent Labs    08/09/19 0354  TSH 2.777   Anemia Panel: No results for input(s): VITAMINB12, FOLATE, FERRITIN, TIBC, IRON, RETICCTPCT in the last 72 hours. Sepsis Labs: Recent Labs  Lab 08/08/19 0244  LATICACIDVEN 0.9    Recent Results (from the past 240 hour(s))  SARS CORONAVIRUS 2 (TAT 6-24 HRS) Nasopharyngeal Nasopharyngeal Swab     Status: None   Collection Time: 08/08/19 12:15 AM   Specimen: Nasopharyngeal Swab  Result Value Ref Range Status   SARS Coronavirus 2 NEGATIVE NEGATIVE Final    Comment: (NOTE) SARS-CoV-2 target nucleic acids are NOT DETECTED. The SARS-CoV-2 RNA is generally detectable in upper and lower respiratory specimens during the acute phase of infection. Negative  results do not preclude SARS-CoV-2 infection, do not rule out co-infections with other pathogens, and should not be used as the sole basis for treatment or other patient management decisions. Negative results must be combined with clinical observations, patient history, and epidemiological information. The expected result is Negative. Fact Sheet for Patients: SugarRoll.be Fact Sheet for Healthcare Providers: https://www.woods-mathews.com/ This test is not yet approved or cleared by the [place] FDA and  has been authorized for detection and/or diagnosis of SARS-CoV-2 by FDA under an Emergency Use Authorization (EUA). This EUA will remain  in effect (meaning this test can be used) for the duration of the COVID-19 declaration under Section 56 4(b)(1) of the Act, 21 U.S.C. section 360bbb-3(b)(1), unless the authorization is terminated or revoked sooner. Performed at [facility], [place] [address],  [place], [place] [address]      Radiology Studies: No results found.  Scheduled Meds: . buPROPion  150 mg Oral Daily  . enoxaparin (LOVENOX) injection  40 mg Subcutaneous Q24H  . pantoprazole  40 mg Oral BID  . potassium chloride  40 mEq Oral BID  . potassium chloride  40 mEq Oral Once   Continuous Infusions:    LOS: 2 days   Marylu Lund, MD [facility] Pager On Amion  If 7PM-7AM, please contact night-coverage 08/10/2019, 6:08 PM

## 2019-08-10 NOTE — Plan of Care (Signed)

## 2019-08-11 DIAGNOSIS — F12188 Cannabis abuse with other cannabis-induced disorder: Secondary | ICD-10-CM

## 2019-08-11 LAB — COMPREHENSIVE METABOLIC PANEL
ALT: 37 U/L (ref 0–44)
AST: 33 U/L (ref 15–41)
Albumin: 3.6 g/dL (ref 3.5–5.0)
Alkaline Phosphatase: 37 U/L — ABNORMAL LOW (ref 38–126)
Anion gap: 8 (ref 5–15)
BUN: <5 mg/dL — ABNORMAL LOW (ref 6–20)
CO2: 22 mmol/L (ref 22–32)
Calcium: 9.1 mg/dL (ref 8.9–10.3)
Chloride: 108 mmol/L (ref 98–111)
Creatinine, Ser: 0.73 mg/dL (ref 0.44–1.00)
GFR calc Af Amer: >60 mL/min (ref >60)
GFR calc non Af Amer: >60 mL/min (ref >60)
Glucose, Bld: 119 mg/dL — ABNORMAL HIGH (ref 70–99)
Potassium: 3.5 mmol/L (ref 3.5–5.1)
Sodium: 138 mmol/L (ref 135–145)
Total Bilirubin: 0.6 mg/dL (ref 0.3–1.2)
Total Protein: 6.1 g/dL — ABNORMAL LOW (ref 6.5–8.1)

## 2019-08-11 LAB — CBC
HCT: 39.4 % (ref 36.0–46.0)
Hemoglobin: 13.7 g/dL (ref 12.0–15.0)
MCH: 33.9 pg (ref 26.0–34.0)
MCHC: 34.8 g/dL (ref 30.0–36.0)
MCV: 97.5 fL (ref 80.0–100.0)
Platelets: 205 10*3/uL (ref 150–400)
RBC: 4.04 MIL/uL (ref 3.87–5.11)
RDW: 10.9 % — ABNORMAL LOW (ref 11.5–15.5)
WBC: 7.2 10*3/uL (ref 4.0–10.5)
nRBC: 0 % (ref 0.0–0.2)

## 2019-08-11 MED ORDER — POTASSIUM CHLORIDE 10 MEQ/100ML IV SOLN
10.0000 meq | Freq: Once | INTRAVENOUS | Status: AC
  Administered 2019-08-11: 10 meq via INTRAVENOUS

## 2019-08-11 NOTE — Discharge Summary (Addendum)
Physician Discharge Summary  Marissa Castillo [id number] DOB: August 29, 1990 DOA: 08/07/2019  PCP: Marissa Castillo, No Pcp Per  Admit date: 08/07/2019 Discharge date: 08/11/2019  Time spent: 66minutes  Recommendations for Outpatient Follow-up:   Right upper quadrant abdominal pain, intractable nausea and vomiting:  -Admitted with a similar presentation back in December 2020.  -Per Marissa Castillo able to consume food and drink today without nausea and vomiting. -Although Marissa Castillo had been requesting pain medication and antinausea medication states needs to go home, believes she will be okay to discharge. -Marissa Castillo instructed to consume bland diet.  Cannabis Hyperemesis -See RUQ abdominal pain -Counseled Marissa Castillo on syndrome explained that if she went home and began to smoke again symptoms would recur.  High anion gap metabolic acidosis: -Resolved  Mild hypokalemia:  -Resolved   Leukocytosis:  -Resolved -CT without evidence of acute pathology.  UA not suggestive of infection.    Mild AKI: -Resolved   Discharge Diagnoses:  Principal Problem:   Intractable nausea and vomiting Active Problems:   Abdominal pain   Metabolic acidosis   Hypokalemia   AKI (acute kidney injury) [facility])   Discharge Condition: Stable  Diet recommendation: Regular  Filed Weights   08/07/19 1724 08/08/19 0157  Weight: 81.6 kg 80.5 kg    History of present illness:  29 y.o.WFPMHx anxiety, depression, colitis, gastritis, pancreatitis   Presenting with complaints of abdominal pain, nausea, and vomiting.Marissa Castillo reports 3-day history of right upper quadrant abdominal pain and intractable nausea and vomiting. The pain radiates to her right shoulder blade. She has not been able to tolerate p.o. intake. No diarrhea. She has been smoking marijuana to help with her symptoms. States she had a sip of an alcoholicbeverage atan event a few days ago and vomited soon after. Since then, she has not consumed any  alcohol. No fevers, chills, cough, or shortness of breath.  Hospital Course:  Treated with IV fluids, antiemetics.  Stable for discharge  Procedures: 2/27 CT abdomen pelvis W contrast; -underdistention of the distal colon versus less likely mild colitis. Clinical correlation is recommended. No bowel obstruction. -Normal appendix. -Left ovarian dominant follicle/corpus luteum as well as paraovarian cyst similar to prior CT. 2/27 ultrasound abdomen; nondiagnostic   Consultations:   Cultures  2/28 SARS coronavirus 2 negative       Discharge Exam: Vitals:   08/10/19 1402 08/10/19 2152 08/11/19 0545 08/11/19 1600  BP: (!) 134/96 (!) 128/93 (!) 130/103 (!) 134/101  Pulse: 75 76 95 99  Resp: 19 19 18 18   Temp: 97.7 F (36.5 C) 99 F (37.2 C) 98.2 F (36.8 C) 98.7 F (37.1 C)  TempSrc: Oral Oral Oral Oral  SpO2: 100% 100% 100% 100%  Weight:      Height:        General: A/O x4, Cardiovascular: Regular rhythm and rate, negative murmurs rubs or gallops, normal S1/S2 Respiratory: Clear to auscultation bilateral   Discharge Instructions   Allergies as of 08/11/2019      Reactions   Ondansetron Swelling   Swelling (site not acknowledged)   Penicillins Diarrhea   Did it involve swelling of the face/tongue/throat, SOB, or low BP? No Did it involve sudden or severe rash/hives, skin peeling, or any reaction on the inside of your mouth or nose? No Did you need to seek medical attention at a hospital or doctor's office? No When did it last happen? Within the past 10 years If all above answers are "NO", may proceed with cephalosporin use.   Sulfa Antibiotics Rash  Medication List    STOP taking these medications   hydrOXYzine 50 MG tablet Commonly known as: ATARAX/VISTARIL     TAKE these medications   ALPRAZolam 0.5 MG tablet Commonly known as: Xanax Take 1 tablet (0.5 mg total) by mouth at bedtime as needed for anxiety.   buPROPion 150 MG 24 hr  tablet Commonly known as: WELLBUTRIN XL Take 150 mg by mouth daily.   naltrexone 50 MG tablet Commonly known as: DEPADE Take 50 mg by mouth daily.   pantoprazole 40 MG tablet Commonly known as: Protonix Take 1 tablet (40 mg total) by mouth 2 (two) times daily.   promethazine 25 MG tablet Commonly known as: PHENERGAN Take 1 tablet (25 mg total) by mouth every 6 (six) hours as needed for nausea or vomiting.      Allergies  Allergen Reactions  . Ondansetron Swelling    Swelling (site not acknowledged)  . Penicillins Diarrhea    Did it involve swelling of the face/tongue/throat, SOB, or low BP? No Did it involve sudden or severe rash/hives, skin peeling, or any reaction on the inside of your mouth or nose? No Did you need to seek medical attention at a hospital or doctor's office? No When did it last happen? Within the past 10 years If all above answers are "NO", may proceed with cephalosporin use.   . Sulfa Antibiotics Rash      The results of significant diagnostics from this hospitalization (including imaging, microbiology, ancillary and laboratory) are listed below for reference.    Significant Diagnostic Studies: CT ABDOMEN PELVIS W CONTRAST  Result Date: 08/07/2019 CLINICAL DATA:  29 year old female with abdominal pain. EXAM: CT ABDOMEN AND PELVIS WITH CONTRAST TECHNIQUE: Multidetector CT imaging of the abdomen and pelvis was performed using the standard protocol following bolus administration of intravenous contrast. CONTRAST:  150mL OMNIPAQUE IOHEXOL 300 MG/ML  SOLN COMPARISON:  CT abdomen pelvis dated 05/13/2019. FINDINGS: Lower chest: The visualized lung bases are clear. No intra-abdominal free air or free fluid. Hepatobiliary: Apparent fatty infiltration of the liver. No intrahepatic biliary ductal dilatation. The gallbladder is unremarkable. Pancreas: Unremarkable. No pancreatic ductal dilatation or surrounding inflammatory changes. Spleen: Normal in size without focal  abnormality. Adrenals/Urinary Tract: The adrenal glands are unremarkable. The kidneys, visualized ureters, and urinary bladder appear unremarkable as well. Stomach/Bowel: There is diffuse thickened appearance of the distal colon without surrounding inflammatory changes likely represents underdistention. Mild colitis is less likely. Clinical correlation is recommended. There is diffuse submucosal fat deposit along the distal colonic wall, likely sequela prior inflammation. There is no bowel obstruction. The appendix is normal. Vascular/Lymphatic: The abdominal aorta and IVC are unremarkable. No portal venous gas. There is no adenopathy. Reproductive: The uterus is anteverted and grossly unremarkable. The right ovary is unremarkable as well. There is a 15 mm dominant cyst or corpus luteum in the left ovary. A 2.5 cm probable paraovarian cyst as seen previously. Ultrasound may provide better evaluation of the pelvic structures if clinically indicated. Other: None Musculoskeletal: No acute or significant osseous findings. IMPRESSION: 1. Underdistention of the distal colon versus less likely mild colitis. Clinical correlation is recommended. No bowel obstruction. Normal appendix. 2. Left ovarian dominant follicle/corpus luteum as well as paraovarian cyst similar to prior CT. Electronically Signed   By: Anner Crete M.D.   On: 08/07/2019 21:41   US Abdomen Limited  Result Date: 08/07/2019 CLINICAL DATA:  29 year old female with right upper quadrant abdominal pain and vomiting. EXAM: ULTRASOUND ABDOMEN LIMITED RIGHT UPPER QUADRANT  COMPARISON:  CT abdomen pelvis dated 05/13/2019. FINDINGS: Gallbladder: No gallstones or wall thickening visualized. No sonographic Murphy sign noted by sonographer. Common bile duct: Diameter: 4 mm. Liver: The liver is unremarkable. Portal vein is patent on color Doppler imaging with normal direction of blood flow towards the liver. Other: None. IMPRESSION: Unremarkable right upper  quadrant ultrasound. Electronically Signed   By: Anner Crete M.D.   On: 08/07/2019 19:42    Microbiology: Recent Results (from the past 240 hour(s))  SARS CORONAVIRUS 2 (TAT 6-24 HRS) Nasopharyngeal Nasopharyngeal Swab     Status: None   Collection Time: 08/08/19 12:15 AM   Specimen: Nasopharyngeal Swab  Result Value Ref Range Status   SARS Coronavirus 2 NEGATIVE NEGATIVE Final    Comment: (NOTE) SARS-CoV-2 target nucleic acids are NOT DETECTED. The SARS-CoV-2 RNA is generally detectable in upper and lower respiratory specimens during the acute phase of infection. Negative results do not preclude SARS-CoV-2 infection, do not rule out co-infections with other pathogens, and should not be used as the sole basis for treatment or other Marissa Castillo management decisions. Negative results must be combined with clinical observations, Marissa Castillo history, and epidemiological information. The expected result is Negative. Fact Sheet for Patients: SugarRoll.be Fact Sheet for Healthcare Providers: https://www.woods-mathews.com/ This test is not yet approved or cleared by the [place] FDA and  has been authorized for detection and/or diagnosis of SARS-CoV-2 by FDA under an Emergency Use Authorization (EUA). This EUA will remain  in effect (meaning this test can be used) for the duration of the COVID-19 declaration under Section 56 4(b)(1) of the Act, 21 U.S.C. section 360bbb-3(b)(1), unless the authorization is terminated or revoked sooner. Performed at [facility], [place] [address], [place], [place] [address]      Labs: Basic Metabolic Panel: Recent Labs  Lab 08/07/19 1737 08/07/19 1737 08/08/19 0244 08/09/19 0354 08/10/19 0801 08/10/19 2028 08/11/19 0247  NA 135  --  134* 138 139  --  138  K 3.1*   < > 3.1* 2.4* 2.9* 3.3* 3.5  CL 98  --  102 95* 102  --  108  CO2 12*  --  19* 31 28  --  22  GLUCOSE 119*  --  139* 97 104*  --  119*   BUN 12  --  10 <5* <5*  --  <5*  CREATININE 1.23*  --  0.87 0.63 0.63  --  0.73  CALCIUM 10.0  --  8.6* 8.4* 9.1  --  9.1  MG  --   --  1.8  --   --   --   --    < > = values in this interval not displayed.   Liver Function Tests: Recent Labs  Lab 08/07/19 1737 08/08/19 0244 08/09/19 0354 08/10/19 0801 08/11/19 0247  AST 36 24 23 33 33  ALT 44 32 26 29 37  ALKPHOS 44 33* 33* 34* 37*  BILITOT 1.8* 1.1 1.4* 1.0 0.6  PROT 8.6* 6.5 5.7* 5.7* 6.1*  ALBUMIN 5.3* 4.0 3.3* 3.4* 3.6   Recent Labs  Lab 08/07/19 1737  LIPASE 24   No results for input(s): AMMONIA in the last 168 hours. CBC: Recent Labs  Lab 08/07/19 1737 08/09/19 0354 08/11/19 0247  WBC 16.6* 6.7 7.2  HGB 16.5* 13.7 13.7  HCT 47.0* 37.8 39.4  MCV 97.1 94.7 97.5  PLT 329 197 205   Cardiac Enzymes: No results for input(s): CKTOTAL, CKMB, CKMBINDEX, TROPONINI in the last 168 hours.  BNP: BNP (last 3 results) No results for input(s): BNP in the last 8760 hours.  ProBNP (last 3 results) No results for input(s): PROBNP in the last 8760 hours.  CBG: No results for input(s): GLUCAP in the last 168 hours.     Signed:  Dia Crawford, MD [facility] [phone] pager

## 2019-08-11 NOTE — Progress Notes (Signed)
Patient discharged to home with instructions. 

## 2019-08-20 ENCOUNTER — Ambulatory Visit (INDEPENDENT_AMBULATORY_CARE_PROVIDER_SITE_OTHER): Payer: Self-pay | Admitting: Nurse Practitioner

## 2019-08-20 ENCOUNTER — Encounter: Payer: Self-pay | Admitting: Nurse Practitioner

## 2019-08-20 ENCOUNTER — Other Ambulatory Visit: Payer: Self-pay

## 2019-08-20 VITALS — BP 124/64 | HR 122 | Temp 98.6°F | Ht 66.0 in | Wt 178.0 lb

## 2019-08-20 DIAGNOSIS — R9389 Abnormal findings on diagnostic imaging of other specified body structures: Secondary | ICD-10-CM

## 2019-08-20 DIAGNOSIS — R112 Nausea with vomiting, unspecified: Secondary | ICD-10-CM

## 2019-08-20 DIAGNOSIS — Z01818 Encounter for other preprocedural examination: Secondary | ICD-10-CM

## 2019-08-20 DIAGNOSIS — R197 Diarrhea, unspecified: Secondary | ICD-10-CM

## 2019-08-20 MED ORDER — PROMETHAZINE HCL 25 MG RE SUPP: 25.0000 mg | Freq: Two times a day (BID) | RECTAL | 1 refills | Status: AC | PRN

## 2019-08-20 MED ORDER — NA SULFATE-K SULFATE-MG SULF 17.5-3.13-1.6 GM/177ML PO SOLN: ORAL | 0 refills | Status: DC

## 2019-08-20 NOTE — Patient Instructions (Signed)
If you are age 31 or older, your body mass index should be between 23-30. Your Body mass index is 28.73 kg/m. If this is out of the aforementioned range listed, please consider follow up with your Primary Care Provider.  If you are age 82 or younger, your body mass index should be between 19-25. Your Body mass index is 28.73 kg/m. If this is out of the aformentioned range listed, please consider follow up with your Primary Care Provider.   You have been scheduled for an endoscopy and colonoscopy. Please follow the written instructions given to you at your visit today. Please pick up your prep supplies at the pharmacy within the next 1-3 days. If you use inhalers (even only as needed), please bring them with you on the day of your procedure. Your physician has requested that you go to www.startemmi.com and enter the access code given to you at your visit today. This web site gives a general overview about your procedure. However, you should still follow specific instructions given to you by our office regarding your preparation for the procedure.  We have sent the following medications to your pharmacy for you to pick up at your convenience: [place]  Thank you for choosing me and [facility].   Tye Savoy, NP

## 2019-08-20 NOTE — Progress Notes (Signed)
ASSESSMENT / PLAN:   Marissa Castillo is a 29 y.o. female with a pmh significant for, but not necessarily limited to, PTSD, GAD, ADHD, bipolar 1 disorder, IBS, asthma, possible pancreatitis 2019.  # Acute on chronic nausea, vomiting, diarrhea, abdominal pain.  --Two recent admission, CT scan raised some concern for colitis though nausea and vomiting were main symptoms at the time.  --May have gastroparesis.  Though she had used a lot of THC prior to February admission she does not usually use it --Nausea, vomiting worse with anxiety --Symptoms may be functional but will schedule for an EGD to rule out organic disease. The risks and benefits of EGD were discussed and the patient agrees to proceed.  --We will change Phenergan from oral form to suppositories, 25 mg suppository 1 every 8 hours as needed  # Abnormal CT scan suggesting colitis / hx of chronic diarrhea. --Further evaluation patient was scheduled for colonoscopy to be done at the time of EGD. The risks and benefits of colonoscopy with possible polypectomy / biopsies were discussed and the patient agrees to proceed.   # Heterozygous MUTYH mutation, -Recommended to have colon cancer screening starting at age 59.  HPI:     Chief Complaint:  Nausea, vomiting, diarrhea    Patient is a 29 yo female, former medical resident with [facility], who is new to the practice and here for worsening of chronic GI problems.  She has a longstanding history of intermittent nausea, vomiting, diarrhea.  Diarrhea is so common for her that she has actually gotten used to having it.  The nausea and vomiting however have recently progressed from being occassional to frequent.  She was admitted early December with 2 days of nausea, vomiting and right flank pain.  She reported increased alcohol usage over the prior few days. CT scan with contrast 05/13/19 showed mild wall thickening involving the hepatic flexure with some minimal surrounding fat  stranding.   She was evaluated by [facility] who recommended inpatient versus outpatient EGD and colonoscopy.  Patient requested outpatient referral to our practice as she is applying for [name] financial assistance.  At the time of hospital discharge early December patient's symptoms had improved but she was not back to baseline.  She was readmitted to the hospital 08/08/2019 with RUQ pain, intractable nausea and vomiting, and leukocytosis.  Hemoglobin was also elevated so leukocytosis may have been due in part to dehydration. She admits to sometimes smoking marijuana but prior to that admission she had smoked a significant amount of marijuana trying to ease the nausea and vomiting.  CT scan of the abdomen and pelvis with contrast was repeated 08/07/19-  There was under distention of the distal colon versus less likely mild colitis.  Stool studies were never collected, sounds like no significant diarrhea during that admission.    [name] says that her vomiting is definitely worse when anxious.  For anxiety she takes Wellbutrin and Xanax on an as-needed basis . She sometimes vomits partially digested food eaten several hours earlier. She complains of early satiety. She has Phenergan at home but by the time she takes it,  typically the vomiting has already started.  Patient says her mother had IBD, she thinks ulcerative colitis.  Patient has not had any joint aches, skin rashes or unusual weight loss.  Data Reviewed:  05/13/19 CT w/ contrast  mild wall thickening involving the hepatic  flexure with minimal surrounding fat stranding changes which could be due to mild colitis, infectious or inflammatory.  08/07/19 RUQ -  Negative  08/07/19 CT w/ contrast  Underdistention of the distal colon versus less likely mild colitis. Clinical correlation is recommended. No bowel obstruction. Normal appendix  08/07/19 WBC 16.6, now 7.2 Hgb 13.7 Tbili 1.8, now 0.6  Past Medical History:  Diagnosis Date  .  Anxiety   . Asthma   . Attention deficit disorder   . Bipolar disorder ([place])   . Chronic right upper quadrant pain 07/2019  . Colitis   . Depression   . Gastritis 07/2019  . Intractable nausea and vomiting 08/09/2019  . Monoallelic mutation of MUTYH gene   . Myocarditis ([place])   . Pancreatitis   . Pericarditis   . PTSD (post-traumatic stress disorder)      Past Surgical History:  Procedure Laterality Date  . WISDOM TOOTH EXTRACTION     Family History  Problem Relation Age of Onset  . Breast cancer Mother   . Colon polyps Father        adenomaous  . Colon polyps Paternal Uncle        adenomaous  . Esophageal cancer Cousin        pat side  . Rectal cancer Neg Hx    Social History   Tobacco Use  . Smoking status: Never Smoker  . Smokeless tobacco: Never Used  Substance Use Topics  . Alcohol use: Yes    Comment: SOCIAL  . Drug use: Never   Current Outpatient Medications  Medication Sig Dispense Refill  . ALPRAZolam (XANAX) 0.5 MG tablet Take 1 tablet (0.5 mg total) by mouth at bedtime as needed for anxiety. 30 tablet 0  . buPROPion (WELLBUTRIN XL) 150 MG 24 hr tablet Take 150 mg by mouth daily.    . pantoprazole (PROTONIX) 40 MG tablet Take 1 tablet (40 mg total) by mouth 2 (two) times daily. 60 tablet 6  . promethazine (PHENERGAN) 25 MG tablet Take 1 tablet (25 mg total) by mouth every 6 (six) hours as needed for nausea or vomiting. 28 tablet 0   No current facility-administered medications for this visit.   Allergies  Allergen Reactions  . Ondansetron Swelling    Swelling (site not acknowledged)  . Penicillins Diarrhea    Did it involve swelling of the face/tongue/throat, SOB, or low BP? No Did it involve sudden or severe rash/hives, skin peeling, or any reaction on the inside of your mouth or nose? No Did you need to seek medical attention at a hospital or doctor's office? No When did it last happen? Within the past 10 years If all above answers are "NO", may  proceed with cephalosporin use.   . Sulfa Antibiotics Rash     Review of Systems: All systems reviewed and negative except where noted in HPI.   Serum creatinine: 0.73 mg/dL 08/11/19 0247 Estimated creatinine clearance: 110.2 mL/min   Physical Exam:    Wt Readings from Last 3 Encounters:  08/20/19 178 lb (80.7 kg)  08/08/19 177 lb 7.5 oz (80.5 kg)  08/06/19 184 lb 3.2 oz (83.6 kg)    BP 124/64   Pulse (!) 122   Temp 98.6 F (37 C)   Ht 5\' 6"  (1.676 m)   Wt 178 lb (80.7 kg)   BMI 28.73 kg/m  Constitutional:  Pleasant female in no acute distress. Psychiatric: Normal mood and affect. Behavior is normal. EENT: Pupils normal.  Conjunctivae are normal. No  scleral icterus. Neck supple.  Cardiovascular: Normal rate, regular rhythm. No edema Pulmonary/chest: Effort normal and breath sounds normal. No wheezing, rales or rhonchi. Abdominal: Soft, nondistended, nontender. Bowel sounds active throughout. There are no masses palpable. No hepatomegaly. Neurological: Alert and oriented to person place and time. Skin: Skin is warm and dry. No rashes noted.  Tye Savoy, NP  08/20/2019, 2:15 PM  Cc:  Referring Provider Azzie Glatter, FNP

## 2019-08-22 NOTE — Progress Notes (Signed)
I agree with the above note, plan 

## 2019-08-27 ENCOUNTER — Ambulatory Visit: Payer: Medicaid - Out of State | Admitting: Gastroenterology

## 2019-08-30 ENCOUNTER — Telehealth: Payer: Self-pay | Admitting: Nurse Practitioner

## 2019-08-30 NOTE — Telephone Encounter (Signed)
Patient called requesting to possibly pick up prep sample.

## 2019-08-30 NOTE — Telephone Encounter (Signed)
Left message on patients voicemail to pick up prep at front desk It will have her name on it.

## 2019-09-14 ENCOUNTER — Encounter: Payer: Self-pay | Admitting: Clinical

## 2019-09-14 ENCOUNTER — Other Ambulatory Visit: Payer: Self-pay

## 2019-09-14 ENCOUNTER — Encounter: Payer: Self-pay | Admitting: Internal Medicine

## 2019-09-14 ENCOUNTER — Ambulatory Visit: Payer: Medicaid - Out of State | Admitting: Internal Medicine

## 2019-09-14 VITALS — BP 126/88 | HR 72 | Resp 12 | Ht 65.0 in | Wt 186.0 lb

## 2019-09-14 DIAGNOSIS — K0889 Other specified disorders of teeth and supporting structures: Secondary | ICD-10-CM

## 2019-09-14 DIAGNOSIS — R112 Nausea with vomiting, unspecified: Secondary | ICD-10-CM

## 2019-09-14 DIAGNOSIS — F419 Anxiety disorder, unspecified: Secondary | ICD-10-CM

## 2019-09-14 DIAGNOSIS — Z9189 Other specified personal risk factors, not elsewhere classified: Secondary | ICD-10-CM

## 2019-09-14 DIAGNOSIS — J4520 Mild intermittent asthma, uncomplicated: Secondary | ICD-10-CM

## 2019-09-14 DIAGNOSIS — Z13220 Encounter for screening for lipoid disorders: Secondary | ICD-10-CM

## 2019-09-14 DIAGNOSIS — R739 Hyperglycemia, unspecified: Secondary | ICD-10-CM

## 2019-09-14 DIAGNOSIS — J45909 Unspecified asthma, uncomplicated: Secondary | ICD-10-CM | POA: Insufficient documentation
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
## 2019-09-14 NOTE — Patient Instructions (Signed)
[facility]  Directions  Website Address: [address], [place], [place] [address]  Phone: [phone]    

## 2019-09-15 LAB — LIPID PANEL W/O CHOL/HDL RATIO
Cholesterol, Total: 214 mg/dL — ABNORMAL HIGH (ref 100–199)
HDL: 80 mg/dL (ref >39)
LDL Chol Calc (NIH): 119 mg/dL — ABNORMAL HIGH (ref 0–99)
Triglycerides: 84 mg/dL (ref 0–149)
VLDL Cholesterol Cal: 15 mg/dL (ref 5–40)

## 2019-09-15 LAB — COMPREHENSIVE METABOLIC PANEL
ALT: 32 IU/L (ref 0–32)
AST: 35 IU/L (ref 0–40)
Albumin/Globulin Ratio: 1.9 (ref 1.2–2.2)
Albumin: 4.6 g/dL (ref 3.9–5.0)
Alkaline Phosphatase: 67 IU/L (ref 39–117)
BUN/Creatinine Ratio: 11 (ref 9–23)
BUN: 7 mg/dL (ref 6–20)
Bilirubin Total: 0.6 mg/dL (ref 0.0–1.2)
CO2: 23 mmol/L (ref 20–29)
Calcium: 9.6 mg/dL (ref 8.7–10.2)
Chloride: 101 mmol/L (ref 96–106)
Creatinine, Ser: 0.62 mg/dL (ref 0.57–1.00)
GFR calc Af Amer: 140 mL/min/{1.73_m2} (ref >59)
GFR calc non Af Amer: 121 mL/min/{1.73_m2} (ref >59)
Globulin, Total: 2.4 g/dL (ref 1.5–4.5)
Glucose: 91 mg/dL (ref 65–99)
Potassium: 4 mmol/L (ref 3.5–5.2)
Sodium: 137 mmol/L (ref 134–144)
Total Protein: 7 g/dL (ref 6.0–8.5)

## 2019-09-15 LAB — HGB A1C W/O EAG: Hgb A1c MFr Bld: 4.6 % — ABNORMAL LOW (ref 4.8–5.6)
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
## 2019-09-20 ENCOUNTER — Other Ambulatory Visit: Payer: Self-pay

## 2019-09-20 ENCOUNTER — Ambulatory Visit (INDEPENDENT_AMBULATORY_CARE_PROVIDER_SITE_OTHER): Payer: Medicaid - Out of State

## 2019-09-20 ENCOUNTER — Other Ambulatory Visit: Payer: Self-pay | Admitting: Gastroenterology

## 2019-09-20 DIAGNOSIS — Z1159 Encounter for screening for other viral diseases: Secondary | ICD-10-CM

## 2019-09-20 LAB — SARS CORONAVIRUS 2 (TAT 6-24 HRS): SARS Coronavirus 2: NEGATIVE

## 2019-09-21 ENCOUNTER — Other Ambulatory Visit: Payer: Medicaid - Out of State | Admitting: Clinical

## 2019-09-21 ENCOUNTER — Telehealth: Payer: Self-pay | Admitting: Clinical

## 2019-09-21 NOTE — Telephone Encounter (Signed)
LCSW returned call to reschedule today's canceled appointment. LCSW left voicemail if she wants to reschedule.

## 2019-09-22 ENCOUNTER — Other Ambulatory Visit: Payer: Self-pay

## 2019-09-22 ENCOUNTER — Encounter: Payer: Self-pay | Admitting: Gastroenterology

## 2019-09-22 ENCOUNTER — Ambulatory Visit (AMBULATORY_SURGERY_CENTER): Payer: Self-pay | Admitting: Gastroenterology

## 2019-09-22 VITALS — BP 115/56 | HR 79 | Temp 96.9°F | Resp 39 | Ht 65.0 in | Wt 186.0 lb

## 2019-09-22 DIAGNOSIS — K529 Noninfective gastroenteritis and colitis, unspecified: Secondary | ICD-10-CM

## 2019-09-22 DIAGNOSIS — K219 Gastro-esophageal reflux disease without esophagitis: Secondary | ICD-10-CM

## 2019-09-22 DIAGNOSIS — K3189 Other diseases of stomach and duodenum: Secondary | ICD-10-CM

## 2019-09-22 DIAGNOSIS — R112 Nausea with vomiting, unspecified: Secondary | ICD-10-CM

## 2019-09-22 DIAGNOSIS — R197 Diarrhea, unspecified: Secondary | ICD-10-CM

## 2019-09-22 DIAGNOSIS — K2900 Acute gastritis without bleeding: Secondary | ICD-10-CM

## 2019-09-22 DIAGNOSIS — B9681 Helicobacter pylori [H. pylori] as the cause of diseases classified elsewhere: Secondary | ICD-10-CM

## 2019-09-22 DIAGNOSIS — K2950 Unspecified chronic gastritis without bleeding: Secondary | ICD-10-CM

## 2019-09-22 MED ORDER — SODIUM CHLORIDE 0.9 % IV SOLN: 500.0000 mL | Freq: Once | INTRAVENOUS | Status: DC

## 2019-09-22 NOTE — Progress Notes (Signed)
Called to room to assist during endoscopic procedure.  Patient ID and intended procedure confirmed with present staff. Received instructions for my participation in the procedure from the performing physician.  

## 2019-09-22 NOTE — Progress Notes (Signed)
Pt's states no medical or surgical changes since previsit or office visit. 

## 2019-09-22 NOTE — Progress Notes (Signed)
DT- vitals JB- temp 

## 2019-09-22 NOTE — Op Note (Signed)
[facility] Patient Name: Marissa Castillo Procedure Date: 09/22/2019 9:40 AM MRN: [id number] Endoscopist: Milus Banister , MD Age: 29 Referring MD:  Date of Birth: [birth year] Gender: Female Account #: [id number] Procedure:                Upper GI endoscopy Indications:              Nausea with vomiting Medicines:                Monitored Anesthesia Care Procedure:                Pre-Anesthesia Assessment:                           - Prior to the procedure, a History and Physical                            was performed, and patient medications and                            allergies were reviewed. The patient's tolerance of                            previous anesthesia was also reviewed. The risks                            and benefits of the procedure and the sedation                            options and risks were discussed with the patient.                            All questions were answered, and informed consent                            was obtained. Prior Anticoagulants: The patient has                            taken no previous anticoagulant or antiplatelet                            agents. ASA Grade Assessment: II - A patient with                            mild systemic disease. After reviewing the risks                            and benefits, the patient was deemed in                            satisfactory condition to undergo the procedure.                           After obtaining informed consent, the endoscope was  passed under direct vision. Throughout the                            procedure, the patient's blood pressure, pulse, and                            oxygen saturations were monitored continuously. The                            Endoscope was introduced through the mouth, and                            advanced to the second part of duodenum. The upper                            GI endoscopy was accomplished without  difficulty.                            The patient tolerated the procedure well. Scope In: Scope Out: Findings:                 LA Grade B reflux related esophagitis.                           Moderate inflammation characterized by congestion                            (edema), erythema, friability and granularity was                            found in the entire examined stomach. Biopsies were                            taken with a cold forceps for histology.                           The exam was otherwise without abnormality. Complications:            No immediate complications. Estimated blood loss:                            None. Estimated Blood Loss:     Estimated blood loss: none. Impression:               - LA Grade B reflux related esophagitis.                           - Gastritis. Biopsied.                           - The examination was otherwise normal. Recommendation:           - Patient has a contact number available for                            emergencies. The signs and symptoms of potential  delayed complications were discussed with the                            patient. Return to normal activities tomorrow.                            Written discharge instructions were provided to the                            patient.                           - Resume previous diet.                           - Continue present medications.                           - Await pathology results. Milus Banister, MD 09/22/2019 10:29:18 AM This report has been signed electronically.

## 2019-09-22 NOTE — Progress Notes (Signed)
Report to PACU, RN, vss, BBS= Clear.  

## 2019-09-22 NOTE — Patient Instructions (Signed)
Please see handouts given to you on Gastritis and Esophagitis. Thank you for letting us take care of your healthcare needs today.    YOU HAD AN ENDOSCOPIC PROCEDURE TODAY AT [facility]:   Refer to the procedure report that was given to you for any specific questions about what was found during the examination.  If the procedure report does not answer your questions, please call your gastroenterologist to clarify.  If you requested that your care partner not be given the details of your procedure findings, then the procedure report has been included in a sealed envelope for you to review at your convenience later.  YOU SHOULD EXPECT: Some feelings of bloating in the abdomen. Passage of more gas than usual.  Walking can help get rid of the air that was put into your GI tract during the procedure and reduce the bloating. If you had a lower endoscopy (such as a colonoscopy or flexible sigmoidoscopy) you may notice spotting of blood in your stool or on the toilet paper. If you underwent a bowel prep for your procedure, you may not have a normal bowel movement for a few days.  Please Note:  You might notice some irritation and congestion in your nose or some drainage.  This is from the oxygen used during your procedure.  There is no need for concern and it should clear up in a day or so.  SYMPTOMS TO REPORT IMMEDIATELY:   Following lower endoscopy (colonoscopy or flexible sigmoidoscopy):  Excessive amounts of blood in the stool  Significant tenderness or worsening of abdominal pains  Swelling of the abdomen that is new, acute  Fever of 100F or higher   Following upper endoscopy (EGD)  Vomiting of blood or coffee ground material  New chest pain or pain under the shoulder blades  Painful or persistently difficult swallowing  New shortness of breath  Fever of 100F or higher  Black, tarry-looking stools  For urgent or emergent issues, a gastroenterologist can be reached at any  hour by calling [phone]. Do not use MyChart messaging for urgent concerns.    DIET:  We do recommend a small meal at first, but then you may proceed to your regular diet.  Drink plenty of fluids but you should avoid alcoholic beverages for 24 hours.  ACTIVITY:  You should plan to take it easy for the rest of today and you should NOT DRIVE or use heavy machinery until tomorrow (because of the sedation medicines used during the test).    FOLLOW UP: Our staff will call the number listed on your records 48-72 hours following your procedure to check on you and address any questions or concerns that you may have regarding the information given to you following your procedure. If we do not reach you, we will leave a message.  We will attempt to reach you two times.  During this call, we will ask if you have developed any symptoms of COVID 19. If you develop any symptoms (ie: fever, flu-like symptoms, shortness of breath, cough etc.) before then, please call [phone].  If you test positive for Covid 19 in the 2 weeks post procedure, please call and report this information to [place].    If any biopsies were taken you will be contacted by phone or by letter within the next 1-3 weeks.  Please call us at [phone] if you have not heard about the biopsies in 3 weeks.    SIGNATURES/CONFIDENTIALITY: You and/or your care partner  have signed paperwork which will be entered into your electronic medical record.  These signatures attest to the fact that that the information above on your After Visit Summary has been reviewed and is understood.  Full responsibility of the confidentiality of this discharge information lies with you and/or your care-partner.

## 2019-09-22 NOTE — Op Note (Signed)
[facility] Patient Name: Marissa Castillo Procedure Date: 09/22/2019 9:40 AM MRN: [id number] Endoscopist: Milus Banister , MD Age: 29 Referring MD:  Date of Birth: [birth year] Gender: Female Account #: [id number] Procedure:                Colonoscopy Indications:              Chronic diarrhea Medicines:                Monitored Anesthesia Care Procedure:                Pre-Anesthesia Assessment:                           - Prior to the procedure, a History and Physical                            was performed, and patient medications and                            allergies were reviewed. The patient's tolerance of                            previous anesthesia was also reviewed. The risks                            and benefits of the procedure and the sedation                            options and risks were discussed with the patient.                            All questions were answered, and informed consent                            was obtained. Prior Anticoagulants: The patient has                            taken no previous anticoagulant or antiplatelet                            agents. ASA Grade Assessment: II - A patient with                            mild systemic disease. After reviewing the risks                            and benefits, the patient was deemed in                            satisfactory condition to undergo the procedure.                           After obtaining informed consent, the colonoscope  was passed under direct vision. Throughout the                            procedure, the patient's blood pressure, pulse, and                            oxygen saturations were monitored continuously. The                            Colonoscope was introduced through the anus and                            advanced to the the terminal ileum. The colonoscopy                            was performed without difficulty. The patient                             tolerated the procedure well. The quality of the                            bowel preparation was good. The terminal ileum,                            ileocecal valve, appendiceal orifice, and rectum                            were photographed. Scope In: 10:07:06 AM Scope Out: 10:16:11 AM Scope Withdrawal Time: 0 hours 7 minutes 5 seconds  Total Procedure Duration: 0 hours 9 minutes 5 seconds  Findings:                 The terminal ileum appeared normal.                           The right colon was normal appearing. Biopsies for                            histology were taken with a cold forceps from the                            right colon for evaluation of microscopic colitis.                            jar 1                           The left colon appeared slightly erythematous                            without a discrete transition to normal mucosa in                            the right. Biopsies for histology were taken with a  cold forceps from the left colon for evaluation of                            microscopic colitis. jar 2 Complications:            No immediate complications. Estimated blood loss:                            None. Estimated Blood Loss:     Estimated blood loss: none. Impression:               Normal terminal ileum                           The right colon was normal appearing. Biopsies for                            histology were taken with a cold forceps from the                            right colon for evaluation of microscopic colitis.                            jar 1                           The left colon appeared slightly erythematous                            without a discrete transition to normal mucosa in                            the right. Biopsies for histology were taken with a                            cold forceps from the left colon for evaluation of                             microscopic colitis. jar 2 Recommendation:           - EGD now.                           - Await pathology results. Milus Banister, MD 09/22/2019 10:26:36 AM This report has been signed electronically.

## 2019-09-24 ENCOUNTER — Telehealth (INDEPENDENT_AMBULATORY_CARE_PROVIDER_SITE_OTHER): Payer: Self-pay | Admitting: Clinical

## 2019-09-24 ENCOUNTER — Telehealth: Payer: Self-pay

## 2019-09-24 DIAGNOSIS — F330 Major depressive disorder, recurrent, mild: Secondary | ICD-10-CM

## 2019-09-24 DIAGNOSIS — F411 Generalized anxiety disorder: Secondary | ICD-10-CM

## 2019-09-24 DIAGNOSIS — F4310 Post-traumatic stress disorder, unspecified: Secondary | ICD-10-CM

## 2019-09-24 NOTE — Telephone Encounter (Signed)
  Follow up Call-  Call back number 09/22/2019  Post procedure Call Back phone  # [phone]  Permission to leave phone message Yes     Patient questions:  Do you have a fever, pain , or abdominal swelling? No. Pain Score  0 *  Have you tolerated food without any problems? Yes.    Have you been able to return to your normal activities? Yes.    Do you have any questions about your discharge instructions: Diet   No. Medications  No. Follow up visit  No.  Do you have questions or concerns about your Care? No.  Actions: * If pain score is 4 or above: No action needed, pain <4. 1. Have you developed a fever since your procedure? no  2.   Have you had an respiratory symptoms (SOB or cough) since your procedure? no  3.   Have you tested positive for COVID 19 since your procedure no  4.   Have you had any family members/close contacts diagnosed with the COVID 19 since your procedure?  no   If yes to any of these questions please route to Joylene John, RN and Erenest Rasher, RN
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
## 2019-10-01 ENCOUNTER — Other Ambulatory Visit: Payer: Self-pay

## 2019-10-01 ENCOUNTER — Ambulatory Visit: Payer: Self-pay | Admitting: Clinical

## 2019-10-01 DIAGNOSIS — F4310 Post-traumatic stress disorder, unspecified: Secondary | ICD-10-CM

## 2019-10-01 DIAGNOSIS — F411 Generalized anxiety disorder: Secondary | ICD-10-CM
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
## 2019-10-07 ENCOUNTER — Ambulatory Visit: Payer: Self-pay | Admitting: Clinical

## 2019-10-07 ENCOUNTER — Other Ambulatory Visit: Payer: Self-pay

## 2019-10-07 DIAGNOSIS — F4310 Post-traumatic stress disorder, unspecified: Secondary | ICD-10-CM
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
## 2019-10-14 ENCOUNTER — Other Ambulatory Visit: Payer: Self-pay

## 2019-10-15 ENCOUNTER — Other Ambulatory Visit: Payer: Self-pay

## 2019-10-15 ENCOUNTER — Ambulatory Visit (INDEPENDENT_AMBULATORY_CARE_PROVIDER_SITE_OTHER): Payer: Self-pay | Admitting: Clinical

## 2019-10-15 ENCOUNTER — Encounter: Payer: Self-pay | Admitting: Internal Medicine

## 2019-10-15 DIAGNOSIS — F411 Generalized anxiety disorder: Secondary | ICD-10-CM

## 2019-10-15 NOTE — Addendum Note (Signed)
Addended by: Lujean Rave on: 10/15/2019 03:22 PM   Modules accepted: Level of Service

## 2019-10-15 NOTE — Progress Notes (Signed)
   THERAPY PROGRESS NOTE  Session Time: 9:00-10:00 Participation Level: Active Behavioral Response: CasualAlert Type of Therapy: Individual Therapy Treatment Goals addressed:  Coping with anxiety, anger and depression.    Purpose: LCSW met with client for routine individual therapy to work towards treatment goals: Coping with anxiety, anger and depression.   Intervention: LCSW  assessed for any significant events and assessed how they are doing today. LCSW utilized intervention of CBT to decrease anxiety/depressive symptoms. LCSW provided reflective listening as patient processed these feelings. LCSW utilized handout STOP technique to utilize when ruminating thoughts that would trigger use of alcohol. Due to mothers day weekend patient and LCSW developed an agenda for patient to utilize to process this day due to patient's mom passing in a healthy way. LCSW and patient processed upcoming anxiety for trip. LCSW assessed for SI/HI/command psychosis  Effectiveness: Patient is alert x4 affect. Patient identified she is angry, frustrated due to recent gathering with her partners friends and this feeling towards her partner. Patient shared awareness she tends to push these feelings to her partner when in reality it is towards her father. Patient shared her partner acknowledged these feelings to her and validated her feelings. Patient participated in session sharing typical mothers day weekend she would be drinking to numb the feeling but as she wants to heal has decided to a a self care vacation. Patient processed although if she decides to stay home and partner goes to House there will be "separation anxiety" she acknowledged in session she wants to be alone to process healing her way by doing yoga, her Opal Sidles videos (mindfulness), tea time and watch netflix.   Patient processed some anxiety with upcoming trip due to the plane ride, LCSW discussed using looking forward approach to help cope with this  upcoming by looking at it as a vacation and positive experiences such as whale sailing.  Intervention was effective as patient was able to reflect and participated in the practice of skill. Progress towards goal is Ongoing. Patient denied active suicidal/homicidal/active psychosis.  Patient shared she is down to 3. Patient shared 1 month supply lasted her 3 months and wanted to know if provider is willing to refill.   Plan Patient offered next appointment for: 05/11 9am  Diagnosis: Generalized Anxiety Disorder    Lujean Rave, LCSW 10/15/2019

## 2019-10-19 ENCOUNTER — Other Ambulatory Visit: Payer: Self-pay

## 2019-10-19 ENCOUNTER — Ambulatory Visit (INDEPENDENT_AMBULATORY_CARE_PROVIDER_SITE_OTHER): Payer: Self-pay | Admitting: Clinical

## 2019-10-19 ENCOUNTER — Other Ambulatory Visit (INDEPENDENT_AMBULATORY_CARE_PROVIDER_SITE_OTHER): Payer: Self-pay

## 2019-10-19 DIAGNOSIS — D582 Other hemoglobinopathies: Secondary | ICD-10-CM

## 2019-10-19 DIAGNOSIS — R7309 Other abnormal glucose: Secondary | ICD-10-CM

## 2019-10-19 DIAGNOSIS — F4310 Post-traumatic stress disorder, unspecified: Secondary | ICD-10-CM
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
## 2019-10-19 NOTE — Telephone Encounter (Signed)
Hi Dr. Amil Amen!   I am traveling to [facility] in 5 days and wanted to see whether you would consider sending a refill for my xanax for the trip?    I am down to 3 now, and I already know I will need one for the plane there and one back.   Thanks so much!  Brody   To Dr. Amil Amen for approval. This came over as a My Chart message.

## 2019-10-20 ENCOUNTER — Telehealth: Payer: Self-pay | Admitting: Internal Medicine

## 2019-10-20 DIAGNOSIS — F419 Anxiety disorder, unspecified: Secondary | ICD-10-CM

## 2019-10-20 DIAGNOSIS — F410 Panic disorder [episodic paroxysmal anxiety] without agoraphobia: Secondary | ICD-10-CM

## 2019-10-20 MED ORDER — ALPRAZOLAM 0.5 MG PO TABS: ORAL_TABLET | ORAL | 0 refills | Status: DC

## 2019-10-20 NOTE — Telephone Encounter (Signed)
Spoke with patient. She would like Rx to go to [facility] on [address]. Patient also verbalized understanding that this is for her trip and Dr. Amil Amen does not prescribed for long term use or for chronic recurrent issues.

## 2019-10-22 LAB — LACTATE DEHYDROGENASE, ISOENZYMES
(LD) Fraction 1: 21 % (ref 17–32)
(LD) Fraction 2: 33 % (ref 25–40)
(LD) Fraction 3: 21 % (ref 17–27)
(LD) Fraction 4: 10 % (ref 5–13)
(LD) Fraction 5: 15 % (ref 4–20)
LDH: 205 IU/L (ref 119–226)

## 2019-10-22 LAB — CBC WITH DIFFERENTIAL/PLATELET
Basophils Absolute: 0 10*3/uL (ref 0.0–0.2)
Basos: 1 %
EOS (ABSOLUTE): 0.1 10*3/uL (ref 0.0–0.4)
Eos: 1 %
Hematocrit: 42.5 % (ref 34.0–46.6)
Hemoglobin: 14.6 g/dL (ref 11.1–15.9)
Immature Grans (Abs): 0 10*3/uL (ref 0.0–0.1)
Immature Granulocytes: 0 %
Lymphocytes Absolute: 1.3 10*3/uL (ref 0.7–3.1)
Lymphs: 25 %
MCH: 34.7 pg — ABNORMAL HIGH (ref 26.6–33.0)
MCHC: 34.4 g/dL (ref 31.5–35.7)
MCV: 101 fL — ABNORMAL HIGH (ref 79–97)
Monocytes Absolute: 0.4 10*3/uL (ref 0.1–0.9)
Monocytes: 7 %
Neutrophils Absolute: 3.4 10*3/uL (ref 1.4–7.0)
Neutrophils: 66 %
Platelets: 203 10*3/uL (ref 150–450)
RBC: 4.21 x10E6/uL (ref 3.77–5.28)
RDW: 12.4 % (ref 11.7–15.4)
WBC: 5.1 10*3/uL (ref 3.4–10.8)

## 2019-10-22 LAB — RETICULOCYTES: Retic Ct Pct: 1.6 % (ref 0.6–2.6)

## 2019-10-22 LAB — HAPTOGLOBIN: Haptoglobin: 93 mg/dL (ref 33–278)

## 2019-10-28 ENCOUNTER — Other Ambulatory Visit: Payer: Self-pay | Admitting: Clinical

## 2019-11-04 ENCOUNTER — Ambulatory Visit: Payer: Self-pay | Admitting: Clinical

## 2019-11-04 DIAGNOSIS — F4310 Post-traumatic stress disorder, unspecified: Secondary | ICD-10-CM

## 2019-11-09 NOTE — Progress Notes (Deleted)
   THERAPY PROGRESS NOTE  Session Time: 9:00-10:00 Participation Level: Active Behavioral Response: CasualAlertCooperative Type of Therapy: Individual Therapy Treatment Goals addressed: Coping anxiety and depressive symptoms and decrease of maladaptive coping skills(ex) alcohol) .    Purpose: LCSW met with client for routine individual therapy to work towards treatment goals:  Intervention: LCSW met with patient for routine individual therapy. LCSW asked patient to provide brief check in since last session. LCSW provided reflective listening as patient processed recent events. LCSW provided strength based approach as patient was able to identify her use of coping skills and making healthy choices for her goals. LCSW finished session using a mindfulness exercise using pipecleaner for patient to identify how she feels to summarize session. LCSW assessed for SI/HI.  Effectiveness: Patient is alert x4 affect. Patient identified she is feeling happy as she just got engaged on her vacation. Patient shared she only took her xanax on the way there; while on vacation was able to be in the moment and on the way back less anxiety due to the joy she felt. Patient shared. Patient participated in session sharing ____. Intervention was effective as patient was able to reflect. Progress towards goal is Ongoing. Patient denied active suicidal/homicidal/active psychosis.  Plan Patient offered next appointment for:   Diagnosis: {psych axis 1:31909}    Lujean Rave, LCSW 11/09/2019
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
## 2019-11-11 ENCOUNTER — Ambulatory Visit: Payer: Self-pay | Admitting: Clinical

## 2019-11-11 DIAGNOSIS — F4310 Post-traumatic stress disorder, unspecified: Secondary | ICD-10-CM
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
## 2019-11-15 ENCOUNTER — Other Ambulatory Visit: Payer: Self-pay

## 2019-11-15 ENCOUNTER — Emergency Department (HOSPITAL_COMMUNITY): Payer: Self-pay

## 2019-11-15 ENCOUNTER — Encounter (HOSPITAL_COMMUNITY): Payer: Self-pay

## 2019-11-15 ENCOUNTER — Emergency Department (HOSPITAL_COMMUNITY)
Admission: EM | Admit: 2019-11-15 | Discharge: 2019-11-15 | Disposition: A | Discharge status: Home / Self Care | Payer: Self-pay | Attending: Emergency Medicine | Admitting: Emergency Medicine

## 2019-11-15 DIAGNOSIS — Z79899 Other long term (current) drug therapy: Secondary | ICD-10-CM | POA: Insufficient documentation

## 2019-11-15 DIAGNOSIS — F319 Bipolar disorder, unspecified: Secondary | ICD-10-CM | POA: Insufficient documentation

## 2019-11-15 DIAGNOSIS — R0602 Shortness of breath: Secondary | ICD-10-CM | POA: Insufficient documentation

## 2019-11-15 DIAGNOSIS — R112 Nausea with vomiting, unspecified: Secondary | ICD-10-CM | POA: Insufficient documentation

## 2019-11-15 DIAGNOSIS — R0789 Other chest pain: Secondary | ICD-10-CM | POA: Insufficient documentation

## 2019-11-15 DIAGNOSIS — R519 Headache, unspecified: Secondary | ICD-10-CM | POA: Insufficient documentation

## 2019-11-15 DIAGNOSIS — R109 Unspecified abdominal pain: Secondary | ICD-10-CM | POA: Insufficient documentation

## 2019-11-15 LAB — COMPREHENSIVE METABOLIC PANEL
ALT: 68 U/L — ABNORMAL HIGH (ref 0–44)
AST: 67 U/L — ABNORMAL HIGH (ref 15–41)
Albumin: 4.1 g/dL (ref 3.5–5.0)
Alkaline Phosphatase: 48 U/L (ref 38–126)
Anion gap: 15 (ref 5–15)
BUN: 8 mg/dL (ref 6–20)
CO2: 18 mmol/L — ABNORMAL LOW (ref 22–32)
Calcium: 8.9 mg/dL (ref 8.9–10.3)
Chloride: 103 mmol/L (ref 98–111)
Creatinine, Ser: 0.88 mg/dL (ref 0.44–1.00)
GFR calc Af Amer: >60 mL/min (ref >60)
GFR calc non Af Amer: >60 mL/min (ref >60)
Glucose, Bld: 141 mg/dL — ABNORMAL HIGH (ref 70–99)
Potassium: 4.8 mmol/L (ref 3.5–5.1)
Sodium: 136 mmol/L (ref 135–145)
Total Bilirubin: 1 mg/dL (ref 0.3–1.2)
Total Protein: 6.7 g/dL (ref 6.5–8.1)

## 2019-11-15 LAB — I-STAT VENOUS BLOOD GAS, ED
Acid-base deficit: 8 mmol/L — ABNORMAL HIGH (ref 0.0–2.0)
Bicarbonate: 15.8 mmol/L — ABNORMAL LOW (ref 20.0–28.0)
Calcium, Ion: 1.09 mmol/L — ABNORMAL LOW (ref 1.15–1.40)
HCT: 47 % — ABNORMAL HIGH (ref 36.0–46.0)
Hemoglobin: 16 g/dL — ABNORMAL HIGH (ref 12.0–15.0)
O2 Saturation: 61 %
Potassium: 4.1 mmol/L (ref 3.5–5.1)
Sodium: 138 mmol/L (ref 135–145)
TCO2: 17 mmol/L — ABNORMAL LOW (ref 22–32)
pCO2, Ven: 27.3 mmHg — ABNORMAL LOW (ref 44.0–60.0)
pH, Ven: 7.37 (ref 7.250–7.430)
pO2, Ven: 32 mmHg (ref 32.0–45.0)

## 2019-11-15 LAB — BASIC METABOLIC PANEL
Anion gap: 28 — ABNORMAL HIGH (ref 5–15)
BUN: 9 mg/dL (ref 6–20)
CO2: 13 mmol/L — ABNORMAL LOW (ref 22–32)
Calcium: 9.3 mg/dL (ref 8.9–10.3)
Chloride: 98 mmol/L (ref 98–111)
Creatinine, Ser: 0.9 mg/dL (ref 0.44–1.00)
GFR calc Af Amer: >60 mL/min (ref >60)
GFR calc non Af Amer: >60 mL/min (ref >60)
Glucose, Bld: 88 mg/dL (ref 70–99)
Potassium: 3.9 mmol/L (ref 3.5–5.1)
Sodium: 139 mmol/L (ref 135–145)

## 2019-11-15 LAB — CBC
HCT: 45.4 % (ref 36.0–46.0)
Hemoglobin: 15.7 g/dL — ABNORMAL HIGH (ref 12.0–15.0)
MCH: 35.4 pg — ABNORMAL HIGH (ref 26.0–34.0)
MCHC: 34.6 g/dL (ref 30.0–36.0)
MCV: 102.5 fL — ABNORMAL HIGH (ref 80.0–100.0)
Platelets: 279 10*3/uL (ref 150–400)
RBC: 4.43 MIL/uL (ref 3.87–5.11)
RDW: 11.4 % — ABNORMAL LOW (ref 11.5–15.5)
WBC: 12.1 10*3/uL — ABNORMAL HIGH (ref 4.0–10.5)
nRBC: 0 % (ref 0.0–0.2)

## 2019-11-15 LAB — I-STAT BETA HCG BLOOD, ED (MC, WL, AP ONLY): I-stat hCG, quantitative: <5 m[IU]/mL (ref <5)

## 2019-11-15 LAB — LIPASE, BLOOD: Lipase: 19 U/L (ref 11–51)

## 2019-11-15 LAB — ETHANOL: Alcohol, Ethyl (B): 21 mg/dL — ABNORMAL HIGH (ref <10)

## 2019-11-15 LAB — SALICYLATE LEVEL: Salicylate Lvl: <7 mg/dL — ABNORMAL LOW (ref 7.0–30.0)

## 2019-11-15 LAB — HEPATIC FUNCTION PANEL
ALT: 87 U/L — ABNORMAL HIGH (ref 0–44)
AST: 96 U/L — ABNORMAL HIGH (ref 15–41)
Albumin: 4.9 g/dL (ref 3.5–5.0)
Alkaline Phosphatase: 56 U/L (ref 38–126)
Bilirubin, Direct: 0.2 mg/dL (ref 0.0–0.2)
Indirect Bilirubin: 1.2 mg/dL — ABNORMAL HIGH (ref 0.3–0.9)
Total Bilirubin: 1.4 mg/dL — ABNORMAL HIGH (ref 0.3–1.2)
Total Protein: 8 g/dL (ref 6.5–8.1)

## 2019-11-15 LAB — LACTIC ACID, PLASMA
Lactic Acid, Venous: 2.4 mmol/L (ref 0.5–1.9)
Lactic Acid, Venous: 1.3 mmol/L (ref 0.5–1.9)

## 2019-11-15 LAB — TROPONIN I (HIGH SENSITIVITY): Troponin I (High Sensitivity): <2 ng/L (ref <18)

## 2019-11-15 MED ORDER — KETOROLAC TROMETHAMINE 30 MG/ML IJ SOLN
15.0000 mg | Freq: Once | INTRAMUSCULAR | Status: AC
  Administered 2019-11-15: 15 mg via INTRAVENOUS
  Filled 2019-11-15: qty 1

## 2019-11-15 MED ORDER — PROMETHAZINE HCL 25 MG/ML IJ SOLN
12.5000 mg | Freq: Once | INTRAMUSCULAR | Status: AC
  Administered 2019-11-15: 12.5 mg via INTRAVENOUS
  Filled 2019-11-15: qty 1

## 2019-11-15 MED ORDER — DROPERIDOL 2.5 MG/ML IJ SOLN
1.2500 mg | Freq: Once | INTRAMUSCULAR | Status: AC
  Administered 2019-11-15: 1.25 mg via INTRAVENOUS
  Filled 2019-11-15: qty 2

## 2019-11-15 MED ORDER — LIDOCAINE VISCOUS HCL 2 % MT SOLN
15.0000 mL | Freq: Once | OROMUCOSAL | Status: AC
  Administered 2019-11-15: 15 mL via ORAL
  Filled 2019-11-15: qty 15

## 2019-11-15 MED ORDER — LACTATED RINGERS IV BOLUS
1500.0000 mL | Freq: Once | INTRAVENOUS | Status: AC
  Administered 2019-11-15: 1500 mL via INTRAVENOUS

## 2019-11-15 MED ORDER — ALUM & MAG HYDROXIDE-SIMETH 200-200-20 MG/5ML PO SUSP
30.0000 mL | Freq: Once | ORAL | Status: AC
  Administered 2019-11-15: 30 mL via ORAL
  Filled 2019-11-15: qty 30

## 2019-11-15 MED ORDER — SODIUM CHLORIDE 0.9 % IV BOLUS
1000.0000 mL | Freq: Once | INTRAVENOUS | Status: AC
  Administered 2019-11-15: 1000 mL via INTRAVENOUS

## 2019-11-15 MED ORDER — METOCLOPRAMIDE HCL 5 MG/ML IJ SOLN
10.0000 mg | Freq: Once | INTRAMUSCULAR | Status: AC
  Administered 2019-11-15: 10 mg via INTRAVENOUS
  Filled 2019-11-15: qty 2

## 2019-11-15 MED ORDER — MORPHINE SULFATE (PF) 2 MG/ML IV SOLN: 2.0000 mg | Freq: Once | INTRAVENOUS | Status: DC

## 2019-11-15 MED ORDER — HYDROMORPHONE HCL 1 MG/ML IJ SOLN
1.0000 mg | Freq: Once | INTRAMUSCULAR | Status: AC
  Administered 2019-11-15: 1 mg via INTRAVENOUS
  Filled 2019-11-15: qty 1

## 2019-11-15 NOTE — Discharge Instructions (Addendum)

## 2019-11-15 NOTE — ED Notes (Signed)
Patient verbalizes understanding of discharge instructions. Opportunity for questioning and answers were provided. Armband removed by staff, pt discharged from ED ambulatory with significant other. No [employer] computer available for [employer]

## 2019-11-15 NOTE — ED Provider Notes (Addendum)
Patient taken in sign out at shift change.  Patient here with recurrent abdominal pain, nausea and vomiting.  She has a history of cyclic vomiting, migraine headaches, anion gap acidosis and hypokalemia.  Patient presents with intractable vomiting, generalized abdominal pain, headache, chest pain.  She noticed some abnormal vaginal discharge today but does not feel that is related.  The patient at first would not provide history and history was given to previous provider by the boyfriend.  He states that she did smoke marijuana recently.  The patient notes that they had [place] over the weekend and she did "more drinking than normal."  She has gotten Dilaudid, Phenergan and metoclopramide prior to my evaluation, she is sitting up in bed, drinking and chewing on ice chips.  I reviewed the patient's labs.  Currently the patient has a low bicarb level.  She has an anion gap of 28.  White blood cell count elevated 12.1 likely reactive with a macrocytosis.  Lipase within normal limits.  She has a slightly elevated AST ALT and total bili.  Patient has been seen previously for episodes of intractable vomiting and has not followed up with gastroenterology.  11:13 PM BP 140/79 (BP Location: Right Arm)   Pulse 90   Temp 98.6 F (37 C) (Oral)   Resp 18   Ht 5\' 5"  (1.651 m)   Wt 86.2 kg   SpO2 100%   BMI 31.62 kg/m   Patient with high serum anion gap.  No acidosis, slightly elevated lactic acid level.  Gap and lactic acidosis has resolved after IV fluids 30 mL/kg.  She required multiple antiemetics.  Patient is feeling much better.  She has had no vomiting for the past several hours is tolerating p.o. fluids and appears appropriate for discharge at this time.    Clinical Course as of Nov 15 2311  Mon Nov 15, 2019  1641 Elevated lactic acid level. Taken after fluids.   Lactic acid, plasma(!!) [AH]    Clinical Course User Index [AH] Margarita Mail, PA-C   Margarita Mail, PA-C 11/15/19 1915     Isla Pence, MD 11/15/19 2106    Margarita Mail, PA-C 11/15/19 2313    Isla Pence, MD 11/15/19 [phone]

## 2019-11-15 NOTE — ED Triage Notes (Addendum)
Pt arrives POV for eval of migraine, chest pain, abd pain, vomiting and dizziness. Reports hx of gastritis, but states this feels different. No zofran given in triage d/t allergy, states also attempted phenergan at home w/ no relief. Pt reports HA started yesterday w/ CP and vomiting onset this AM. Reports no hx of same. LMP approx 6/3, states "weird discharge" also
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
## 2019-11-17 ENCOUNTER — Telehealth (INDEPENDENT_AMBULATORY_CARE_PROVIDER_SITE_OTHER): Payer: Self-pay | Admitting: Clinical

## 2019-11-17 ENCOUNTER — Other Ambulatory Visit: Payer: Self-pay | Admitting: Clinical

## 2019-11-17 ENCOUNTER — Telehealth: Payer: Self-pay | Admitting: Clinical

## 2019-11-17 ENCOUNTER — Other Ambulatory Visit: Payer: Self-pay

## 2019-11-17 DIAGNOSIS — F411 Generalized anxiety disorder: Secondary | ICD-10-CM

## 2019-11-17 NOTE — Progress Notes (Signed)
   THERAPY PROGRESS NOTE  Session Time: 4:30-5:30pm (virtual via updox) Participation Level: Active Behavioral Response: CasualAlertAnxious Type of Therapy: Individual Therapy Treatment Goals addressed: Reducing Anxiety symptoms (ruminating)    Purpose: LCSW met with client for routine individual therapy to work towards treatment goals: reducing anxiety symptoms (ruminating thoughts)  Intervention: LCSW provided a brief in with patient to assessed for any significant events and how she is doing today. LCSW utilized intervention of CBT along with psychotherapy with purpose to identify anxiety triggers and processing this anxiety. LCSW provided reflective listening as patient processed. LCSW and patient utilized CBT to challenge those thoughts regarding her wedding and explore strategies how to approach. To finish session LCSW asked patient to identify how she will be taking care of self LCSW assessed for SI/HI/command psychosis.   Effectiveness: Patient is alert x4 affect. Patient processed recent events of going to the ER on Monday following headaches, no appetite for estimated 5 days prior. Patient believes the stressors may have also played a role into feeling sick and has an appointment with specialist next week.Patient shared updates from previous session able to express her thoughts and feelings with her fiance to discuss wedding date with his family.    Patient shared today feeling somewhat better, woke up since 3:30 with racing thoughts. Patient shared these thoughts are related to her past. Patient shared hesitant to invite past individuals due to period of what occurred in the past and the unknown what they think of her and a good friend who is pregnant and wants her as a main of honor but also not hurt her feelings of her role. LCSW discussed  with patient exploring options how to approach these individuals. Patient accepted ideas and will explore these strategies. Patient shared too  reaching out to her fathers family and her mom's family for the wedding. Patient identified awareness of invitations are not an obligation but to individuals she has now who she wants to share her happiness. Patient shared with processing this anxiety and thoughts helped ease some and is starting to feel the joy of wedding planning.     Patient identified self care for the weekend is having a facial, practicing her yoga and walking her dogs. Intervention was effective as patient was able to reflect and challenge her anxiety thoughts. Progress towards goal is Ongoing. Patient denied active suicidal/homicidal/active psychosis.  Plan Patient offered next appointment for: 11/25/2019   Diagnosis: Generalized Anxiety Disorder     Lujean Rave, LCSW 11/17/2019

## 2019-11-18 ENCOUNTER — Other Ambulatory Visit: Payer: Self-pay | Admitting: Clinical

## 2019-11-24 ENCOUNTER — Ambulatory Visit: Payer: Medicaid - Out of State | Admitting: Gastroenterology

## 2019-11-25 ENCOUNTER — Ambulatory Visit: Payer: Self-pay | Admitting: Clinical

## 2019-11-25 DIAGNOSIS — F4310 Post-traumatic stress disorder, unspecified: Secondary | ICD-10-CM

## 2019-11-26 NOTE — Progress Notes (Signed)
   THERAPY PROGRESS NOTE  Session Time: 9:00-10:00 Participation Level: Active Behavioral Response: CasualAlertAnxious Type of Therapy: Individual Therapy Treatment Goals addressed: Anxiety and Coping with symptoms of anger.     Purpose: LCSW met with client for routine individual therapy to work towards treatment goals: reducing anxiety and anger symptoms.    Intervention:  LCSW met with client for routine individual therapy to work towards treatment goals: reducing anxiety and anger symptoms. LCSW provided patient brief check in to assess how she is on this date since last session. LCSW provided reflective listening skills as patient shared current feelings and thoughts. In this session patient processed anxiety provoking due to late project and mixed feelings regarding fathers day weekend and upcoming wedding due to her strain relationship with father. LCSW and patient explored coping skill of DBT skill ACCEPT and discussed E (emotion of exploring opposite emotion) as patient identified ruminating over her feelings towards him is increasing her anxiety. Patient processed her feelings of isolation from her father's family and acknowledged current individuals she has in her life.   LCSW terminated session facilitating a self affirmation exercise "Dear self,___, Love Me." which patient completed out loud.  LCSW assessed for SI/HI/command psychosis.  Effectiveness: Patient is alert x4 affect. Patient identified feeling anxious since this morning been awake since 4am due to anxiety attack from forgetting a deadline for work. Patient identified awareness of being accountable and able to process ways to motivate to turn in this by the end of the day. Patient processed her anxiety symptoms of hyperventilating, numbness and ended up vomiting.    Patient reports concern of medications of wanting to see PCP earlier than appointment made. Patient wants to know if PCP is willing to prescribe her MH  medications here and is willing to try another anxiety medications and see if any changes for her antidepressants.  Patient processed she has been procrastinating and thinking of other stressors in a way to hide her feelings towards father as it is father day weekend. Patient processed anger of how her father became estranged to her. As she processed she became aware it was his decision and she is willing to learn to forgive his actions and move forward. Patient identified gratitude of the individuals she has now and that she had a parent child relationship with her mom and a friend name Henrene Pastor who developed a father child relationship with her.    Intervention was effective as patient was able to reflect and able to process her feelings during this session. Progress towards goal is Ongoing. Patient was able to practice the self affirmation exercise. Patient denied active suicidal/homicidal/active psychosis.  Plan Patient offered next appointment for: 12/02/19  Diagnosis: Post Traumatic Stress Disorder    Lujean Rave, LCSW 11/26/2019

## 2019-12-02 ENCOUNTER — Ambulatory Visit: Payer: Self-pay | Admitting: Clinical

## 2019-12-02 DIAGNOSIS — F411 Generalized anxiety disorder: Secondary | ICD-10-CM

## 2019-12-03 ENCOUNTER — Ambulatory Visit (INDEPENDENT_AMBULATORY_CARE_PROVIDER_SITE_OTHER): Payer: Self-pay | Admitting: Internal Medicine

## 2019-12-03 ENCOUNTER — Other Ambulatory Visit: Payer: Self-pay

## 2019-12-03 ENCOUNTER — Encounter: Payer: Self-pay | Admitting: Internal Medicine

## 2019-12-03 VITALS — BP 128/82 | HR 80 | Resp 12 | Ht 65.0 in | Wt 187.0 lb

## 2019-12-03 DIAGNOSIS — J4520 Mild intermittent asthma, uncomplicated: Secondary | ICD-10-CM

## 2019-12-03 DIAGNOSIS — F4310 Post-traumatic stress disorder, unspecified: Secondary | ICD-10-CM

## 2019-12-03 DIAGNOSIS — F419 Anxiety disorder, unspecified: Secondary | ICD-10-CM

## 2019-12-03 MED ORDER — ALBUTEROL SULFATE HFA 108 (90 BASE) MCG/ACT IN AERS: INHALATION_SPRAY | RESPIRATORY_TRACT | 2 refills | Status: AC

## 2019-12-03 MED ORDER — SERTRALINE HCL 50 MG PO TABS
50.0000 mg | ORAL_TABLET | Freq: Every day | ORAL | 3 refills | Status: DC
Start: 2019-12-03 — End: 2020-03-13

## 2019-12-03 NOTE — Progress Notes (Addendum)
     Subjective:    Patient ID: Marissa Castillo, female   DOB: August 12, 1990, 29 y.o.   MRN: [id number]   HPI   1.  Anxiety becoming more of a problem.  Counseling is uncovering some difficult things for her and feels she needs more medical help through this time period.      Current Meds  Medication Sig  . albuterol (VENTOLIN HFA) 108 (90 Base) MCG/ACT inhaler Inhale into the lungs as needed for wheezing or shortness of breath. Uses maybe once a year  . buPROPion (WELLBUTRIN XL) 150 MG 24 hr tablet Take 150 mg by mouth daily.   Allergies  Allergen Reactions  . Ondansetron Swelling    Swelling (site not acknowledged)  . Penicillins Diarrhea    Did it involve swelling of the face/tongue/throat, SOB, or low BP? No Did it involve sudden or severe rash/hives, skin peeling, or any reaction on the inside of your mouth or nose? No Did you need to seek medical attention at a hospital or doctor's office? No When did it last happen? Within the past 10 years If all above answers are "NO", may proceed with cephalosporin use.   . Sulfa Antibiotics Rash     Review of Systems    Objective:   BP 128/82 (BP Location: Left Arm, Patient Position: Sitting, Cuff Size: Normal)   Pulse 80   Resp 12   Ht 5\' 5"  (1.651 m)   Wt 187 lb (84.8 kg)   LMP 11/07/2019 (Approximate)   BMI 31.12 kg/m   Physical Exam  NAD Speech not as pressured as in past. Lungs: CTA CV:  RRR without murmur or rub.     Assessment & Plan  1.  Depression/anxiety/PTSD with question of bipolar disorder in past:  Continue Bupropion XL 150 mg daily.  Add low dose Sertraline 25 mg daily with increase to 50 mg in 1 week as toleraged. Following with Dwan Bolt regularly for counseling. Follow up in 1-2 weeks with me.  2  Asthma:  Needs refill of rescue inhaler--Rx written for Albuterol.

## 2019-12-03 NOTE — Progress Notes (Signed)
   THERAPY PROGRESS NOTE  Session Time: 9:00-10:00 Participation Level: Active Behavioral Response: CasualAlertAnxious  Type of Therapy: Individual Therapy Treatment Goals addressed: Anger and Anxiety   Purpose: LCSW met with client for routine individual therapy to work towards treatment goals:reducing anxiety, anger, and depressive symptoms.   Intervention: LCSW provided patient brief moment to check in. LCSW  assessed for any significant events and assessed how they are doing today since last session. LCSW utilized intervention of DBT coping skills to address impulse behaviors that were processed in session and discussed the emotion of anger.  LCSW utilized handout of emotion regulation of emotions (anger) to address prompting events,and what it can be experienced as, purpose of this is to help patient recognize when she may be feeling it. LCSW facilitated with patient STOP,THINK, GO as a technique to help with impulse. LCSW provided validation of patients feelings from this session with practicing DBT skill of Experience your emotion, with this patient felt the emotion of anger and identified there are healthier ways to manage it . LCSW asked patient what she will be doing to care for self until next session. LCSW assessed for SI/HI/command psychosis.  Effectiveness: Patient is alert x4 affect. Patient identified she is anxious as she woke up again at 4:30 with anxiety and ended up puking. Patient shared she took a moment to reflect and noticed anxiety a sense of emotions as she is processing her thoughts and trauma. Patient reports she knew it was going to be difficult but feels as if the chemical imbalance with medications is not as effective. Patient reports she knows therapy is beginning to help but is unsure if the medication dose either too low or making it worse. Patient shared she processed the idea of writing a letter to provider with a friend at least to share how she is feeling. Patient  shared although did not want to get up this morning she was still able to get up and go for her walk with her fiance.   Patient also shared in session being triggered and flashback to how her stepmother treated her growing up. Patient shared as she reflects on her past she feels a wave of anger. Patient processed this anger in this session. Patient recognized emotions of anger and anxiety usually end up with wanting to puke or wanted to drink to numb the feeling instead of just the social drink. Patient reflected on young adulthood going through bulimia. Patient denied wanted to do these behaviors however it is if like her body is mimicking the impulse behavior. Patient practiced the STOP, THINK, GO skill to challenge these impulse behaviors.    Intervention was effective as patient was able to reflect, practiced the skill and able to reflect. Patient shared take away of this session ,"be patient with self." Patient shared she will self care by scheduling a reiki and a massage. Progress towards goal is Ongoing. Patient denied active suicidal/homicidal/active psychosis.  Plan Patient offered next appointment for: 07/01 9am  Diagnosis: Generalized Anxiety Disorder and Post Traumatic Stress Disorder    Lujean Rave, LCSW 12/03/2019

## 2019-12-09 ENCOUNTER — Ambulatory Visit (INDEPENDENT_AMBULATORY_CARE_PROVIDER_SITE_OTHER): Payer: Self-pay | Admitting: Clinical

## 2019-12-09 ENCOUNTER — Other Ambulatory Visit: Payer: Self-pay | Admitting: Clinical

## 2019-12-09 DIAGNOSIS — F411 Generalized anxiety disorder: Secondary | ICD-10-CM

## 2019-12-16 ENCOUNTER — Ambulatory Visit: Payer: Medicaid - Out of State | Admitting: Internal Medicine

## 2019-12-16 ENCOUNTER — Encounter: Payer: Self-pay | Admitting: Internal Medicine

## 2019-12-16 ENCOUNTER — Other Ambulatory Visit: Payer: Self-pay | Admitting: Clinical

## 2019-12-16 ENCOUNTER — Ambulatory Visit (INDEPENDENT_AMBULATORY_CARE_PROVIDER_SITE_OTHER): Payer: Self-pay | Admitting: Clinical

## 2019-12-16 VITALS — BP 130/70 | HR 80 | Resp 12 | Ht 65.0 in | Wt 183.0 lb

## 2019-12-16 DIAGNOSIS — F4310 Post-traumatic stress disorder, unspecified: Secondary | ICD-10-CM

## 2019-12-16 DIAGNOSIS — F419 Anxiety disorder, unspecified: Secondary | ICD-10-CM

## 2019-12-16 DIAGNOSIS — H547 Unspecified visual loss: Secondary | ICD-10-CM

## 2019-12-16 DIAGNOSIS — H9191 Unspecified hearing loss, right ear: Secondary | ICD-10-CM

## 2019-12-16 DIAGNOSIS — Z9109 Other allergy status, other than to drugs and biological substances: Secondary | ICD-10-CM

## 2019-12-16 MED ORDER — FLUTICASONE PROPIONATE 50 MCG/ACT NA SUSP: NASAL | 11 refills | Status: AC

## 2019-12-16 MED ORDER — HYDROXYZINE HCL 50 MG PO TABS: ORAL_TABLET | ORAL | 2 refills | Status: DC

## 2019-12-16 MED ORDER — BUPROPION HCL ER (XL) 150 MG PO TB24: 150.0000 mg | ORAL_TABLET | Freq: Every day | ORAL | 11 refills | Status: AC

## 2019-12-16 NOTE — Progress Notes (Signed)
   THERAPY PROGRESS NOTE  Session Time:3-4:00pm (virtual) Participation Level: Active Behavioral Response: CasualAlertEuphoric Type of Therapy: Individual Therapy Treatment Goals addressed: Anxiety   Purpose: LCSW met with client for routine individual therapy to work towards treatment goals: reducing anxiety, anger, and depressive symptoms.   Intervention: LCSW  assessed for any significant events and assessed how they are doing today. LCSW provided reflective listening as patient provided updates of how she is doing and what she is practicing. LCSW utilized handout DBT crisis survival skills TIPP to discuss skill to manage practicing harmful behaviors (puking). LCSW assessed for SI/HI/command psychosis.  Effectiveness: Patient is alert x4 affect. Patient identified she is doing well today. Patient reports no alcohol for a 1 week. Patient reports feeling calmer with zoloft medication. Reports on Saturday another GERD episode- but as she reflected it feels as it was influenced by overwhelmness of hearing stressors from her friends. Reports she caught this moment, and mentally coped "I know this is in my head" and turned off her phone and felt better each day since then.   Patient shared updates of having a conversation with another group network and ended up being a interview for a potential employment opportunity. Patient reports her reiki was effective it helped her feel calm and a healthy grounding technique. Patient reports the reiki facilitator informed her the root of her tense.   Patient participated in session sharing feeling great to share her practices and progress since last seeion. Intervention was effective as patient was able to reflect and accepted strategy of TIPP. Progress towards goal is Ongoing. Patient denied active suicidal/homicidal/active psychosis.  Plan Patient offered next appointment for: 12/16/2019 3pm  Diagnosis: Generalized Anxiety Disorder and Post Traumatic  Stress Disorder    Lujean Rave, LCSW 12/16/2019

## 2019-12-16 NOTE — Progress Notes (Signed)
Subjective:    Patient ID: Marissa Castillo, female   DOB: Apr 13, 1991, 29 y.o.   MRN: [id number]   HPI   Anxiety:  Was on Sertraline 25 mg and increased to 50 mg this past week.  Spiraled with alcohol for 2 days at beginning of week as her maid of honor "friend dumped"  Her.  She is sleeping better.   No suicidal thoughts.   Allergies:  Worsening with wheezing as vents to home she is living in open to crawl space below.  They are trying to get fixed.  She has responded to Hydroxyzine in past and also helped with sleep.   Lies in bed awake often, though does get out of bed to read.   Fairly regular bedtime.   Walks every morning at 7 generally 2-2.5 miles.   No caffeine after lunch.   Decreased hearing--wondering if retracted TM or if cerumen impaction.  Right ear much worse.    Vision:  Trouble with distance vision.    Left lower later thigh:  Flat wart and seems to be enlarging  Current Meds  Medication Sig  . albuterol (VENTOLIN HFA) 108 (90 Base) MCG/ACT inhaler 2 puffs every 4-6 hours as needed for shortness of breath  . buPROPion (WELLBUTRIN XL) 150 MG 24 hr tablet Take 150 mg by mouth daily.  . sertraline (ZOLOFT) 50 MG tablet Take 1 tablet (50 mg total) by mouth daily.   Allergies  Allergen Reactions  . Ondansetron Swelling    Swelling (site not acknowledged)  . Penicillins Diarrhea    Did it involve swelling of the face/tongue/throat, SOB, or low BP? No Did it involve sudden or severe rash/hives, skin peeling, or any reaction on the inside of your mouth or nose? No Did you need to seek medical attention at a hospital or doctor's office? No When did it last happen? Within the past 10 years If all above answers are "NO", may proceed with cephalosporin use.   . Sulfa Antibiotics Rash     Review of Systems    Objective:   BP 130/70 (BP Location: Left Arm, Patient Position: Sitting, Cuff Size: Normal)   Pulse 80   Resp 12   Ht 5\' 5"  (1.651 m)   Wt 183 lb (83 kg)    LMP 12/09/2019   BMI 30.45 kg/m   Physical Exam  NAD HEENT:  PERRL, EOMI, Discs sharp.  Brownish discoloration of right iris, well demarcated.  TMs perhaps a bit dull.  Nasal mucosa boggy and without erythema.  Throat with mild cobbling. Neck:  Supple, No adenopathy Chest:  CTA without wheeze currently CV:  RRR without murmur or rub.  Radial and DP pulses normal and equal. Skin:  Flat warty lesion, skin color, left lateral thigh.   Assessment & Plan   1.  Anxiety/depression/PTSD:  Bupropion XL 150 mg daily and now Sertraline 50 mg daily, the latter to calm anxiety more.  Watch closely for signs of hypomania/mania with questionable history of bipolar disorder in past.  Patient does not feel she had mania--more of a reaction to stressors going on in her life and medication at the time. Following closely with Dwan Bolt, LCSW  2.  Allergies/asthma/decreased hearing:  Start hydoxyzine to also help with anxiety as needed.  Fluticasone.  Referral to ENT, though feel most likely has eustachian tube dysfunction. Looking into improving ventilation of home.  Offered referral to [facility] for recommendations. She will think about it.  3.  Decreased visual acuity  and brown spot on iris:  Referral to [place].  4.  Flat wart, left thigh:  Will order liquid nitrogen to bring her back and freeze.

## 2019-12-21 NOTE — Progress Notes (Signed)
   THERAPY PROGRESS NOTE  Session Time: 3:00pm-4:00pm. Participation Level: Active Behavioral Response: CasualAlertEuthymic Type of Therapy: Individual Therapy Treatment Goals addressed: Working towards decreasing anger symptoms and working decreasing alcohol use.    Purpose: LCSW met with client for routine individual therapy to work towards treatment goals: Working towards decreasing anger symptoms and working decreasing alcohol use.    Intervention: LCSW  assessed for any significant events and assessed how she is doing today. LCSW utilized intervention of DBT to decrease anger symptoms and alcohol use. LCSW processed with patient recent alcohol withdrawal episode to identify what triggered the use of the alcohol. LCSW challenged patient to identify the trigger and what coping skills she could have used that would have been effective releasing her emotions she had trouble managing.  LCSW utilized handout DBT skills Distress Tolerance and Emotion regulation. Handouts utilized to facilitate discussion were DT: Radical Acceptance to continue previous session discussion and emotion regulation handout 11-Anger which discussed steps of opposite action, and Anger STOP sign steps. LCSW provided patient resources from previous session. LCSW assessed for SI/HI/command psychosis.  Effectiveness: Patient is alert x4 affect. Patient identified she is okay on this date. Patient provided her wedding updates and shared recent spiritual recharge with her birthday celebration. Patient processed continued puking however in this session patient identified it was intensified due to increase of alcohol use following "friend breakup." In this session patient identified from this event was angry which led to wanting to numb her feeling leading to withdrawals (anxiety,vomitting). Intervention was effective as patient identified previous coping skills of exercise in her past and it's reason it was effective. Patient  identified alternatives to release anger in healthy way are yoga, meditation and exercise (push ups or a run). Patient was able to follow along DBT coping skills discussed for reference how to regulate painful emotions like anger. Intervention was effective as patient was able to reflect and become aware of her alcohol use becoming a maladaptive coping skill instead of casual/social use. Progress towards goal is Ongoing. Patient denied active suicidal/homicidal/active psychosis.  Plan Patient offered next appointment for: 07/15 9am  Diagnosis: PTSD R/o Alcohol Use   Lujean Rave, LCSW 12/21/2019

## 2019-12-23 ENCOUNTER — Ambulatory Visit: Payer: Self-pay | Admitting: Clinical

## 2019-12-23 DIAGNOSIS — F411 Generalized anxiety disorder: Secondary | ICD-10-CM

## 2019-12-23 DIAGNOSIS — F4310 Post-traumatic stress disorder, unspecified: Secondary | ICD-10-CM

## 2019-12-24 NOTE — Progress Notes (Signed)
   THERAPY PROGRESS NOTE  Session Time: 9:00-10:00 Participation Level: Active Behavioral Response: CasualAlertEuthymic Type of Therapy: Individual Therapy Treatment Goals addressed: Anger, Anxiety and Coping with these emotions.    Purpose: LCSW met with client for routine individual therapy to work towards treatment goals: learning coping skill to decrease anxiety, and anger symptoms.  Intervention: LCSW  assessed for any significant events and assessed how they are doing today since last session. LCSW provided reflective listening skill as patient processed updates and concerns with her current employment. LCSW utilized intervention of CBT to discuss reframing maladaptive thoughts to pause and be present of what and how feels presently. LCSW provided patient anxiety coping skills cards to utilize when she experiences panic attacks. LCSW assessed for SI/HI/command psychosis.  Effectiveness: Patient is alert x4 affect. Patient identified she has a good week. Patient reports drinking less (didn't assess amount of decrease) and feels as if zoloft is starting to work. 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 Patient reports she has been practicing her own coping skills by learning and utilizing deep breathing and adding one more breath as she exhales, reports this has been helpful.   In regards to her wedding feeling less stressed as she send out save the dates, and has decided to not send invite to her fathers family as she processed not getting a congratulations from her father or family. Patient identified she wants to have individuals in her life who play a current role and are supportive and feels at peace with this decision.   Patient processed as she is feeling better she battles the thought "do I deserve him or this" as patient shared she has trouble with abandonment thoughts.  Patient identified gratitude towards her fiance. Patient identified a goal to help at home is by cleaning and organizing one of her rooms to thank him of his support.   Patient identified will practicing coping skills of being present and looking forward to activities with her fiance. Progress towards goal is Ongoing. Patient denied active suicidal/homicidal/active psychosis.  Plan Patient offered next appointment for: 07/22 virtual  Diagnosis: Post Traumatic Stress Disorder    Lujean Rave, LCSW 12/24/2019

## 2019-12-30 ENCOUNTER — Telehealth (INDEPENDENT_AMBULATORY_CARE_PROVIDER_SITE_OTHER): Payer: Self-pay | Admitting: Clinical

## 2019-12-30 DIAGNOSIS — F411 Generalized anxiety disorder: Secondary | ICD-10-CM

## 2019-12-30 NOTE — Progress Notes (Signed)
° °  THERAPY PROGRESS NOTE Virtual visit  Session Time: 9:00-10:00am Participation Level: Active Behavioral Response: CasualAlertEuthymic Type of Therapy: Individual Therapy Treatment Goals addressed: Anxiety and Coping   Purpose: LCSW met with client for routine individual therapy to work towards treatment goals: decreasing anxiety symptoms.   Intervention: LCSW  assessed for any significant events and assessed how she is doing today since last session. LCSW assessed any coping skills practiced for this week and assess substance use intake. 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 LCSW utilized this intervention in this session by working with patient to identify her strengths and self determination with these opportunities presenting despite her trauma and other challenges. LCSW provided reflective listening as patient shared her strengths. LCSW encouraged patient to utilize her grounding coping skills prior to her interview. LCSW finished session by having patient identify an affirmation.  LCSW assessed for SI/HI/command psychosis.  Effectiveness: Patient is alert x4 affect. Patient identified she is feeling good on this date. Reports zoloft is continuing to work, did take 1 xanax she had found. Reports drinking "going in the right direction, identified her drinking was not utilized to numb any painful feelings but more to celebrate these steps.    Patient did share having a nightmare of all the things that have scared her, LCSW informed this could be her subconscious anxiety. Patient reports she did puke but self talk with acknowledging she is okay and to look forward to her interview on the morning. 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   Patient  shared coping skills has been utilizing her journal and prepared self with identifying points to discuss. Patient reports going to her class the morning off to help her ground for the interview, and feel confident. Patient reports doing therapy the morning off also will help her feel grounded.   Reports in regards to father- "come to peace with it" and reports she was able to half clean her room and do laundry (from previous session).    Intervention was effective as patient was able to reflect, identified her strengths and small victories working toward the path she wants to work towards. Ended session by identifying affirmation.  Progress towards goal is Ongoing. Patient denied active suicidal/homicidal/active psychosis.  Plan Patient offered next appointment for: 07/29 9am  Diagnosis: Generalized Anxiety Disorder    Lujean Rave, LCSW 12/30/2019

## 2020-01-06 ENCOUNTER — Ambulatory Visit (INDEPENDENT_AMBULATORY_CARE_PROVIDER_SITE_OTHER): Payer: Self-pay | Admitting: Clinical

## 2020-01-06 DIAGNOSIS — F4310 Post-traumatic stress disorder, unspecified: Secondary | ICD-10-CM

## 2020-01-06 DIAGNOSIS — F411 Generalized anxiety disorder: Secondary | ICD-10-CM

## 2020-01-11 NOTE — Progress Notes (Signed)
   THERAPY PROGRESS NOTE  Session Time: 9:00-10:00am Participation Level: Active Behavioral Response: CasualAlertAnxious Type of Therapy: Individual Therapy Treatment Goals addressed: Anxiety and Coping   Purpose: LCSW met with client for routine individual therapy to work towards treatment goals: learning coping skill to decrease anxiety related to trauma symptoms for this visit.   Intervention: LCSW  assessed for any significant events and assessed how they are doing today since initial visit. Before beginning session LCSW utilized a grounding exercise of deep breathing to help patient ground due to anxiety physical symptoms. LCSW utilized intervention of DBT and CBT coping skills to decrease anxiety depressive symptoms related to her trauma symptoms (flashbacks) as patient processed feelings due to recent events. LCSW utilized handout guided imagery "calm lake" to facilitate mindfulness exercise before leaving session. LCSW assessed for SI/HI/command psychosis.  Effectiveness: Patient is alert x4 affect. 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    Patient identified she is feeling anxious as she did puke on the way. Patient processed this occurred as she was talking to her fiance about recent events with her uncle on the way to clinic.  Patient shared in session her uncle in [place] was hospitalized due to their [place], this led to have a flashback of the time she was hospitalized due to suicidal ideations, and other MH symptoms. Patient processed the sadness of her step mother and father reaction when she was hospitalized and the thoughts of that year.  Patient processed anxiety thought "what if" these events were to occur then.  Patient did note alcohol use increased this week (unknown of amount) due to these recent events which led to also the puking aside from her anxiety attack.    LCSW  challenged patient utilizing CBT/DBT to note what she is doing now, the healing process of the therapy, her work and her relationship with herself and fiance, patient was then able to process gratitude and help ground self to current healing. Patient noted unable to really process the year where she was hospitalized and began to cry in session.  Patient participated in the guided meditation exercise of the lake. Patient reports this helped her visually release these thoughts and feelings.     Intervention was effective as patient was able to reflect and participate in the session. Progress towards goal is Ongoing. Patient denied active suicidal/homicidal/active psychosis.  Plan Patient offered next appointment for: 01/13/2020 9am.   Diagnosis: Generalized Anxiety Disorder and Post Traumatic Stress Disorder    Lujean Rave, LCSW 01/11/2020

## 2020-01-13 ENCOUNTER — Ambulatory Visit (INDEPENDENT_AMBULATORY_CARE_PROVIDER_SITE_OTHER): Payer: Self-pay | Admitting: Clinical

## 2020-01-13 DIAGNOSIS — F411 Generalized anxiety disorder: Secondary | ICD-10-CM

## 2020-01-18 NOTE — Progress Notes (Signed)
   THERAPY PROGRESS NOTE  Session Time: 9:00-10:00 Participation Level: Active Behavioral Response: CasualAlertCooperative Type of Therapy: Individual Therapy Treatment Goals addressed: Anxiety and Coping   Purpose: LCSW met with client for routine individual therapy to work towards treatment goals: learning coping skills to decrease anxiety symptoms.   Intervention: LCSW provided patient a brief check in to assess for any significant events and assessed how she is doing today. LCSW utilized intervention of CBT in the facilitation of discussion as patient processed her own behavioral changes and it's affect on her feelings and thoughts progress. LCSW utilized handout DBT house for patient to review her progress and utilization of coping skills and areas where she would like to address. LCSW facilitated coping skills with patient by making a worry doll activity, deep breathing with a feather, and listening and reading lyrics of a song and how it could be apply to patient's experience. LCSW assessed for SI/HI/command psychosis. Due to being out of town next week, LCSW reviewed crisis plan and resources if needed.   Effectiveness: Patient is alert x4 affect. Patient identified she is feeling calm and happy. Patient reports she was still sick on the weekend of previous session, however  patient shared she has made changes to her eating and drinking habits. With these changes she is beginning to feel "fine". Patient reports drinking 1/2 a drink each night, and is enjoying the taste to where she can control the tolerance and not drink to numb any feelings. Patient shared her fiance and herself are working together to decrease the amount of alcohol in the household to begin to make changes together due to it's observable effects. 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   Patient  participated and engaged in the [place] and was able to see her progress of the feelings she is working towards and healing. Patient participated in the deep breathing and worry doll activity. Listening and reading to the lyrics, patient responded by sharing seeing the approach of challenging maladaptive thoughts and practicing being mindful day by day.    Intervention was effective as patient was able to reflect on her progress and use of coping skills effectiveness. Progress towards goal is Ongoing. Patient denied active suicidal/homicidal/active psychosis.  Plan Patient offered next appointment for: 08/20 10am  Diagnosis: Generalized Anxiety Disorder    Lujean Rave, LCSW 01/18/2020

## 2020-01-28 ENCOUNTER — Telehealth (INDEPENDENT_AMBULATORY_CARE_PROVIDER_SITE_OTHER): Payer: Self-pay | Admitting: Clinical

## 2020-01-28 DIAGNOSIS — F411 Generalized anxiety disorder: Secondary | ICD-10-CM

## 2020-01-31 ENCOUNTER — Telehealth: Payer: Self-pay | Admitting: Clinical

## 2020-02-02 NOTE — Progress Notes (Signed)
   THERAPY PROGRESS NOTE Via [employer]  Session Time: 10:00am-11:00am  Participation Level: Active Behavioral Response: CasualAlertEuthymic  Type of Therapy: Individual Therapy Treatment Goals addressed: Anxiety and Coping   Purpose: LCSW met with client for routine individual therapy to work towards treatment goals: coping with anxiety symptoms and reducing alcohol use.   Intervention: LCSW met with patient for routine individual therapy to continue to work towards decreasing anxiety symptoms and reducing her alcohol use. LCSW  provided reflective listening as patient processed how she has been since previous session. LCSW utilized intervention of Solution Focused to identify ideas to ease her anxiety in regards to the pandemic and her wedding and identify anxiety coping skills to manage flight response. LCSW praised patient with her changes she has practiced to help her reduce the amount of alcohol. LCSW assessed for SI/HI/command psychosis.  Effectiveness: Patient is alert x4 affect via virtual. Patient checked in she has been very good. Identified she has been able to drink at a decent amount "social" use on her date night. Patient identified no cravings for more or thinking about it, nor drinking alone. Patient does report 1 slip up of more (4glasses) and identified she manipulated her fiance for more. Patient is sharing more acknowledgment of these changes and is motivated to continue to make changes. Patient reports changes of digestion is improving, no more anxiety in the morning, and sleep has improved.  Patient identified her flight response of being on edge from being raised to be "fast" co current with her anxiety led to hurting her fiance's feeling. Patient shared her fiance gave the idea of getting a new camera set, patient denied and fiance insisted. Patient shared she got snappy at him and noticed her fiance appeared bothered. Patient shared she and fiance spoke after this event occurred  and were able to discuss about it. LCSW praised patient on her awareness and apologizing to her fiance. Patient was able to identify these moments are helping growth their relationship. LCSW shared idea of affirmation that she is not her father. Patient would like to learn more strategies to practice mindfulness to decrease her flight response.   Patient identified continued anxiety with her wedding. Patient thanked LCSW with ideas of taking precautions such as putting masks available, hand sanitizer and asking her cooks if she wants them to wear gloves or masks. Patient shared these ideas she will discuss with her fiance but this helped ease her anxiety regarding a wedding during the pandemic.   Progress towards goal is Ongoing. Patient denied active suicidal/homicidal/active psychosis.  Plan Patient offered next appointment for: 08/27 9am   Diagnosis: Generalized Anxiety Disorder; Alcohol use disorder     Lujean Rave, LCSW 02/02/2020

## 2020-02-04 ENCOUNTER — Ambulatory Visit: Payer: Self-pay | Admitting: Clinical

## 2020-02-04 DIAGNOSIS — F411 Generalized anxiety disorder: Secondary | ICD-10-CM

## 2020-02-08 NOTE — Progress Notes (Signed)
   THERAPY PROGRESS NOTE  Session Time: 9:00-10:00 Participation Level: Active Behavioral Response: CasualAlertEuthymic Type of Therapy: Individual Therapy Treatment Goals addressed: Anxiety and Coping    Purpose: LCSW met with client for routine individual therapy to work towards treatment goals: coping with anxiety symptoms and reducing alcohol use.   Intervention: LCSW met with patient for routine individual therapy to continue to work towards treatment goals. LCSW provided a brief check in for patient to share how she has been doing and coping skills she has practiced. LCSW provided reflective listening as patient processed changes of behaviors and feelings. Patient wanted to process her dream she had and the undecided to invite her father to her wedding. LCSW utilized intervention of Supportive therapy to support her decision making process. LCSW provided patient with handouts of slowing down and mindfulness for previous virtual appointment. LCSW provided patient with skills of mindful walking to practice. LCSW assessed for SI/HI/command psychosis.  Effectiveness: Patient is alert x4 affect. Patient identified she is feeling really great as she is looking forward to her bachelor party with her friends and looking forward to a yoga festival. Patient reports she continues to provide series with [employer] and will speak with them regarding potential employment. Patient shared continue to work on reducing her alcohol use, shared triggered when a scam delayed her invitation and went to immediately drink a cup of wine however felt it cope with the stress. Patient reports she did not drink more as before she would drink 5 glasses and do nothing else. Patient reports no withdrawals and her tolerance is much lower. Patient reports she will continue to work on these changes.  Patient processed in this session having nice dream about her father in the future. Patient processed she oftens wonders if she will be  able to have a relationship with him. Patient processed in early adulthood she would often go to him and apologize and say "you are right" and seek forgiveness. However when she noticed she often looked for him first and began noticing his traits. Patient shared in 2018 is when she stopped reaching out to him. Patient shared she is still indecisive to invite him to the wedding, but realizes he has yet to reach out for congratulations. Patient shared she will reach out to a cousin to speak to her aunt on dad's side to process these thoughts and is hopeful this will help with her decision as it is someone who knows some of what happened. Patient shared even if she did invite her father, for her her father is Henrene Pastor and he will walk her down the aisle. Patient reports continued growing relationship with her fiance. Patient also shares she will practice mindful walking as it helps connect with her mother through nature. Intervention was effective as patient was able to reflect. Progress towards goal is Ongoing. Patient denied active suicidal/homicidal/active psychosis.  Plan Patient offered next appointment for: 09/03 9am  Diagnosis: Generalized Anxiety Disorder    Lujean Rave, LCSW 02/08/2020

## 2020-02-09 ENCOUNTER — Ambulatory Visit: Payer: Self-pay | Admitting: Internal Medicine

## 2020-02-11 ENCOUNTER — Ambulatory Visit: Payer: Self-pay | Admitting: Clinical

## 2020-02-11 DIAGNOSIS — F411 Generalized anxiety disorder: Secondary | ICD-10-CM

## 2020-02-11 NOTE — Telephone Encounter (Signed)
This has been resolved

## 2020-02-17 ENCOUNTER — Other Ambulatory Visit: Payer: Self-pay

## 2020-02-17 ENCOUNTER — Telehealth (INDEPENDENT_AMBULATORY_CARE_PROVIDER_SITE_OTHER): Payer: Self-pay | Admitting: Clinical

## 2020-02-17 DIAGNOSIS — F411 Generalized anxiety disorder: Secondary | ICD-10-CM

## 2020-02-17 MED ORDER — HYDROXYZINE HCL 50 MG PO TABS: ORAL_TABLET | ORAL | 2 refills | Status: AC

## 2020-02-17 NOTE — Progress Notes (Signed)
   THERAPY PROGRESS NOTE  Session Time: 9:00-10:00 Participation Level: Active Behavioral Response: CasualAlertEuthymic Type of Therapy: Individual Therapy Treatment Goals addressed: Anxiety and Coping   Purpose: LCSW met with client for routine individual therapy to work towards treatment goals: managing anxiety symptoms and reducing alcohol use.   Intervention: LCSW met with patient for routine individual therapy to continue to work towards treatment goals. LCSW provided patient a brief check in and assessed for any significant events. LCSW utilized intervention of Motivational Interviewing as patient processed practicing behavioral change as she shares she has continued motivatation to work towards decreasing her alcohol use. In this session patient also processed her distressing dreams. LCSW informed of skill of journaling her thoughts as this can assist to release them instead of internalizing. LCSW assessed for SI/HI/command psychosis.  Effectiveness: Patient is alert x4 affect. Patient identified she recently had her engagement pictures done. Patient did shared on the way back from her pictures she had craving thoughts, although it was not one a day she has planned to drink, she wanted some and her fiance suggested not anymore as they had some during the pictures. Patient reports she agreed but had a thought "well I will just stop by near by" reports with the drive   Patient reports some continued dreams about her father and feeling doubtful if she should invite her father. Patient reports she did feel distressed but was able to practice her skills and remind herself she wants to feel happy on her wedding and for right now want her loved ones to be present. Intervention was effective as patient was able to reflect and process in this session. Patient shared she is hopeful her yoga retreat will help her with this process. Progress towards goal is Ongoing. Patient denied active  suicidal/homicidal/active psychosis.  Plan Patient offered next appointment for: 09/09  Diagnosis: Generalized Anxiety Disorder    Lujean Rave, LCSW 02/17/2020

## 2020-02-17 NOTE — Progress Notes (Signed)
   THERAPY PROGRESS NOTE Virtual  Session Time: 10:00-11:00 (70minutes) Participation Level: Active Behavioral Response: CasualAlertEuthymic Type of Therapy: Individual Therapy Treatment Goals addressed: Anxiety and Coping   Purpose: LCSW met with client for routine individual therapy to work towards treatment goals: reducing alcohol and managing anixiety symptoms.   Intervention: LCSW met with patient for routine therapy to work towards reducing alcohol use and managing anxiety symptoms. LCSW introduced MSW interns and asked permission to sit in session. LCSW provided patient a brief check in how she is doing today and assessed for any significant events. LCSW provided reflective listening skills as patient processed emotions and events of the weeks.  LCSW utilized intervention of CBT, and supportive strength based during this session. MSW interns provided patient affirmations and assisted patient with identifying her growth and strengths with her relationship with her fiance as she shared her story. CBT was utilized as patient identified skills she used when she was triggered with emotions and thoughts regarding her relationship with father. LCSW assessed for SI/HI/command psychosis.  Effectiveness: Patient is alert x4 affect. Patient identified she is good as she celebrated her engagement party. Patient reports during check in continued distressful dreams regarding her father and is practicing looking at a compassionate perspective and journaling her dreams and restarted gratitude journaling.   Patient reported she drink too much the day before the yoga retreat. There during a class the teacher discussed family and this triggered patient to think of deceased mother and strain relationship with her father and feeling unaccepted. Patient reported throwing up and crying. She ended up leaving the retreat. At home she practiced a DBT skill of self soothe by being in the bathe and using calming aroma.  Patient practiced CBT by acknowledging the feelings and thoughts of the class and practicing her grounding skill.   Patient shared from client perspective suggestions for MSW interns. Patient accepted feedback, encouragement, and affirmations from MSW interns. Patient shared she will utilize these affirmations, continue her yoga exercises and journal.   Intervention was effective as patient was able to reports practicing her skills, thoughts and feelings.. Progress towards goal is Ongoing. Patient denied active suicidal/homicidal/active psychosis.  Plan Patient offered next appointment for: 02/24/20 9:00am  Diagnosis: Alcohol Abuse, Generalized Anxiety Disorder and Post Traumatic Stress Disorder    Lujean Rave, LCSW 02/17/2020

## 2020-02-24 ENCOUNTER — Ambulatory Visit (INDEPENDENT_AMBULATORY_CARE_PROVIDER_SITE_OTHER): Payer: Self-pay | Admitting: Clinical

## 2020-02-24 DIAGNOSIS — F411 Generalized anxiety disorder: Secondary | ICD-10-CM

## 2020-03-02 ENCOUNTER — Other Ambulatory Visit: Payer: Self-pay | Admitting: Clinical

## 2020-03-03 NOTE — Progress Notes (Signed)
° °  THERAPY PROGRESS NOTE  Session Time: 9:00-10:00 Participation Level: Active Behavioral Response: CasualAlertAnxious Type of Therapy: Individual Therapy Treatment Goals addressed: Anxiety and Coping   Purpose: LCSW met with patient for routine individual therapy to work towards treatment goals: reducing alcohol use and decreasing anxiety symptoms.   Intervention: LCSW met with patient for routine individual therapy to continue to work towards treatment goals reducing her alcohol use and decreasing anxiety symptoms. LCSW asked permission from patient to have MSW intern present. LCSW provided patient check in period to assess how she is doing. LCSW provided reflective listening as patient processed the events. LCSW praised patient with her practice of coping skills. For this session LCSW utilized motivational interviewing as patient shared a relapse in the amount of alcohol she had set to manage her use. LCSW provided psychoeducation of the chain of relapse- trigger, thought, craving to use. LCSW asked patient to identify the trigger of emotion and in session patient practiced identifying coping skills when she is triggered to challenge any thought cravings were to present. LCSW and patient identified resources to provide her fiance to learn about addiction recovery and LCSW will work on developing a session where he can come in to learn about patient's treatment as patient would like him to be involved. LCSW finished this session by facilitating a body scan exercise and combining deep breathing with it and encouraged to practice this skill to decrease her anxiety symptoms. LCSW assessed for SI/HI/command psychosis.  Effectiveness: Patient is alert x4 affect. Patient reports "hanging in there" patient shared she has been good busy with planning her wedding. Patient reports it had been a stressful week and had "I want to drink" thinking and relapse on her plan to only drink on certain days/date nights.  Patient reports "I self medicated" to cope with her stress. Patient shared this relapse on her recovery plan led to tension with fiance. Patient shared "I didn't communicate with him how I was feeling" Patient processed acknowledgment her accountability to her recovery plan. Patient participated in the exercise of chain of relapse by identifying the trigger, in session she used her knowledge of medical to release endorphins by doing a skill that releases it. Patient identified getting up to do yoga or going for a walk to ground. Patient identified planning on setting a week day checklist to work towards less pressure with wedding stuff. Patient agreed to a daily goal of not drinking (unless date night,etc) as she verbalized understanding if she sets a long term goal only until the wedding then cycle of addiction can continue after the wedding and her motivation is a healthy body as she plans to have a family. Patient agreed to the one day at a time. Patient identified "the more active I am the less drinking" Patient shared continued coping skills such as coloring, yoga, and grounding exercises. Patient reports she will talk to Fiance about Wonda Horner Non to help him get support as a loved one in addiction and agreed to have him sit in a session too. Patient declined SI/HI/Psychosis.  Plan Patient offered next appointment for: 09/23 10am (was rescheduled)  Diagnosis: Alcohol Abuse and Generalized Anxiety Disorder    Lujean Rave, LCSW 03/03/2020

## 2020-03-09 ENCOUNTER — Ambulatory Visit (INDEPENDENT_AMBULATORY_CARE_PROVIDER_SITE_OTHER): Payer: Self-pay | Admitting: Clinical

## 2020-03-09 DIAGNOSIS — F411 Generalized anxiety disorder: Secondary | ICD-10-CM

## 2020-03-10 ENCOUNTER — Other Ambulatory Visit: Payer: Self-pay | Admitting: Clinical

## 2020-03-13 ENCOUNTER — Encounter: Payer: Self-pay | Admitting: Internal Medicine

## 2020-03-13 ENCOUNTER — Ambulatory Visit: Payer: Self-pay | Admitting: Internal Medicine

## 2020-03-13 VITALS — BP 124/78 | HR 74 | Resp 15 | Ht 65.0 in | Wt 176.0 lb

## 2020-03-13 DIAGNOSIS — F419 Anxiety disorder, unspecified: Secondary | ICD-10-CM

## 2020-03-13 DIAGNOSIS — F411 Generalized anxiety disorder: Secondary | ICD-10-CM

## 2020-03-13 DIAGNOSIS — F410 Panic disorder [episodic paroxysmal anxiety] without agoraphobia: Secondary | ICD-10-CM

## 2020-03-13 DIAGNOSIS — F4310 Post-traumatic stress disorder, unspecified: Secondary | ICD-10-CM

## 2020-03-13 DIAGNOSIS — D1801 Hemangioma of skin and subcutaneous tissue: Secondary | ICD-10-CM

## 2020-03-13 DIAGNOSIS — B078 Other viral warts: Secondary | ICD-10-CM

## 2020-03-13 MED ORDER — SERTRALINE HCL 50 MG PO TABS: ORAL_TABLET | ORAL | 11 refills | Status: AC

## 2020-03-13 MED ORDER — ALPRAZOLAM 0.5 MG PO TABS: ORAL_TABLET | ORAL | 0 refills | Status: AC

## 2020-03-13 NOTE — Progress Notes (Signed)
Subjective:    Patient ID: Marissa Castillo, female   DOB: [birth year], 29 y.o.   MRN: [id number]   HPI   1.  Anxiety and Depression:  Better with Sertraline for calming.  Her stepmother, however, is triggering her trying to cause difficulties with brothers during her wedding weekend.  She could use as needed calming meds until the wedding in 1.5 weeks.  Feels like she needs PMDD symptoms as well.     2.  Alcohol:  Doing better, but was triggered today with her stepmother, who is source of her PTSD, trying to invite herself to hang out with her 2 brothers in [place] during patient's wedding weekend. Stepmother and father were not invited due to trauma from them in her childhood.  She became distraught that her stepmother would try to cause problems during that weekend.      3.  Two skin lesions--left lateral distal thigh and right upper arm--the latter looks like a cherry angioma.  4.  Allergies and ear complaints.  Much better with allergy symptoms when using Hydroxyzine  And nasal fluticasone.    Current Meds  Medication Sig  . albuterol (VENTOLIN HFA) 108 (90 Base) MCG/ACT inhaler 2 puffs every 4-6 hours as needed for shortness of breath  . buPROPion (WELLBUTRIN XL) 150 MG 24 hr tablet Take 1 tablet (150 mg total) by mouth daily.  . fluticasone (FLONASE) 50 MCG/ACT nasal spray 2 sprays each nostril daily.  . hydrOXYzine (ATARAX/VISTARIL) 50 MG tablet 1 tab by mouth at bedtime as needed  . sertraline (ZOLOFT) 50 MG tablet Take 1 tablet (50 mg total) by mouth daily.   Allergies  Allergen Reactions  . Ondansetron Swelling    Swelling (site not acknowledged)  . Penicillins Diarrhea    Did it involve swelling of the face/tongue/throat, SOB, or low BP? No Did it involve sudden or severe rash/hives, skin peeling, or any reaction on the inside of your mouth or nose? No Did you need to seek medical attention at a hospital or doctor's office? No When did it last happen? Within the  past 10 years If all above answers are "NO", may proceed with cephalosporin use.   . Sulfa Antibiotics Rash     Review of Systems    Objective:   BP 124/78 (BP Location: Left Arm, Patient Position: Sitting, Cuff Size: Normal)   Pulse 74   Resp 15   Ht 5\' 5"  (1.651 m)   Wt 176 lb (79.8 kg)   LMP 02/28/2020 (Approximate)   BMI 29.29 kg/m   Physical Exam  NAD 2 mm cherry angioma at posterior mid upper arm.   Flat wart, skin colored with two tiny satellite lesions, left lateral lower thigh.  After obtaining written consent, liquid nitrogen applied to both lesions with sponge applicators Tolerated well   Assessment & Plan  1.  Anxiety/PTSD/Panic Disorder:  Prescription for 5 doses of Xanax 0.5 mg given to use judiciously over next 1.5 weeks until wedding. Discussed trying to enjoy the day and have people she trusts watching out for stepmother attempting to make an appearance.  2.  PMS:  Brings this up during conversation about how Sertraline has helped.  Discussed taking 75 mg of Sertraline on her days with PMS.    3.  Cherry angioma and likely flat wart with tiny satellites:  Treated with liquid nitrogen.    4.  Allergies and eustachian tube dysfunction:  Much improved with hydroxyzine and nasal fluticasone.  5.  Alcohol abuse:  Gradual improvement.  Continues to work with Dwan Bolt, LCSW, LCAS-A for #1 and #5.

## 2020-03-13 NOTE — Patient Instructions (Signed)
Call for bleeding, erythema, discharge, fever  Have a very fun wedding--take time to look around and enjoy it!

## 2020-03-15 NOTE — Progress Notes (Signed)
   THERAPY PROGRESS NOTE  Session Time: 12:00pm-1:00pm Participation Level: Active Behavioral Response: CasualAlertEuthymic Type of Therapy: Individual Therapy Treatment Goals addressed: Learning coping skills to manage anxiety symptoms and continue to work towards managing alcohol abuse.   Purpose: LCSW met with client for routine individual therapy to work towards treatment goals:  Learning coping skills to manage anxiety symptoms and continue to work towards managing alcohol abuse.   Intervention: LCSW asked patient permission for MSW interns to sit in session. LCSW provided patient brief check in to assess any symptoms or significant events since last seen. LCSW provided reflective listening as patient processed events, feelings and any triggers and how she responded. LCSW utilized intervention of Motivational Interviewing and Strength-based as patient processed triggers. LCSW challenged patient to identify those addictive thinking thoughts. Strength based was used as LCSW facilitated self esteem JENGA with purpose to identify her strengths and growth. This activity also helped to continue developing therapeutic relationship as LCSW responded to the questions. LCSW assessed for SI/HI/command psychosis.  Effectiveness: Patient is alert x4 affect. Patient provided check in update she participated in a family therapy session with her brother, in this session patient reported feeling validated of her decision to whether invite her father. Patient processed recognizing she is not alone of the trauma experienced by father and step mother as her brother is processing this in his own therapy. Patient did provide check in of her alcohol use, along with her PMS and her decision to not continue to look for a relationship with her father it triggered to drink more than usual. Patient reports all this led to stress and mourning that relationship with her father. Patient does report saying out loud how she was  feeling to her fiance, did her yoga/ distraction skills this helped at the moment. Patient pointed out that fiance recognized patient having excuses to drink, LCSW pointed out these excuses are addictive thinking.  Patient participated in the self esteem jenga activity and was able to identify strengths and growth as she has started working through healing.    Progress towards goal is Ongoing. Patient denied active suicidal/homicidal/active psychosis.  Plan Patient offered next appointment for: 03/16/2020 9am  Diagnosis: Alcohol Use Disorder and Generalized Anxiety Disorder    Lujean Rave, LCSW 03/15/2020

## 2020-03-16 ENCOUNTER — Other Ambulatory Visit: Payer: Self-pay | Admitting: Clinical

## 2020-03-16 ENCOUNTER — Telehealth: Payer: Self-pay | Admitting: Clinical

## 2020-03-22 ENCOUNTER — Ambulatory Visit: Payer: Self-pay | Admitting: Clinical

## 2020-03-22 DIAGNOSIS — F411 Generalized anxiety disorder: Secondary | ICD-10-CM

## 2020-03-22 DIAGNOSIS — F4310 Post-traumatic stress disorder, unspecified: Secondary | ICD-10-CM

## 2020-03-27 NOTE — Progress Notes (Signed)
° °  THERAPY PROGRESS NOTE  Session Time: 10-12:00pm Participation Level: Active Behavioral Response: CasualAlertEuthymic Type of Therapy: Family Therapy Treatment Goals addressed: Anxiety and Coping to work towards reduce and managing alcohol intake.    Purpose: LCSW met with patient for family therapy session (patient's fiance present) to discuss patient's dual diagnosis treatment.   Intervention:LCSW met with patient for family therapy session (patient's fiance present) to discuss patient's dual diagnosis treatment. LCSW provided a brief check in for patient to assess how she has been since previous session. LCSW provided patient and patient's agenda for this session was check in, and provide introduction. provide fiance opportunity to check in and address any thoughts or feelings in regards to patient's mental health and substance use.  LCSW utilized intervention of Family Systems as an intervention as LCSW provided psychoeducation of patient's treatment, how to support someone's mental health/substance use, and how fiance can support help self and other resources. LCSW assessed for SI/HI/command psychosis.  Effectiveness: Patient is alert x4 affect. Patient identified she is looking forward to her wedding this weekend. Patient identified stressors for the wedding did involve alcohol however seeked alternatives such as the amount of alcohol of it was less than usual. Patients fiance processed in the session observing during the planning isolation would then lead to risk of increase of use. Fiance shared periods of stressors usually led to increase of alcohol use and this is what concerns him going forward. Patients fiance shared the progress from July to now.  Fiance was able to express his feelings and thoughts of concerns and how he has been supportive in her treatment. Patient in session can be heard rationalizing her responses, LCSW was able to point this out to patient. Patient acknowledged  these rationalizations. Fiance identified responsibility and accountability are areas he would like patient to work towards in her treatment. Patient and fiance reflected on this session sharing it was helpful to express to each their own feelings and thoughts even though they do this at home, this session will help LCSW going forward as well by addressing stressors and identifying healthy coping skills. Patient denied any SI/HI.   Progress towards goal is Ongoing. Patient denied active suicidal/homicidal/active psychosis.  Plan Patient offered next appointment for: 04/06/2020 9am  Diagnosis: Post Traumatic Stress Disorder and Alcohol Use Disorder     Lujean Rave, LCSW 03/27/2020

## 2020-04-06 ENCOUNTER — Other Ambulatory Visit: Payer: Self-pay | Admitting: Clinical

## 2020-04-11 ENCOUNTER — Encounter: Payer: Self-pay | Admitting: Internal Medicine

## 2020-04-11 ENCOUNTER — Telehealth: Payer: Self-pay | Admitting: Clinical

## 2020-04-11 ENCOUNTER — Ambulatory Visit (INDEPENDENT_AMBULATORY_CARE_PROVIDER_SITE_OTHER): Payer: Self-pay | Admitting: Internal Medicine

## 2020-04-11 VITALS — HR 106

## 2020-04-11 DIAGNOSIS — R509 Fever, unspecified: Secondary | ICD-10-CM

## 2020-04-11 LAB — POC INFLUENZA TEST: Negative: UNDETERMINED

## 2020-04-11 MED ORDER — AZITHROMYCIN 500 MG PO TABS: ORAL_TABLET | ORAL | 0 refills | Status: AC

## 2020-04-11 NOTE — Progress Notes (Signed)
    Subjective:    Patient ID: Marissa Castillo, female   DOB: 04-14-1991, 29 y.o.   MRN: [id number]   HPI   Started not feeling well about 6 days ago.  Eventually developed cough productive of army green thick mucous.  Had some nausea and vomiting at one point, but that is receding and she is keeping some bland solids and liquids down now.  She has chest wall pain with coughing.  Lots of pressure on face and behind eyes.  Ears are full as well.  Has had chills and fever in past 1-2 days, but has not checked temps.  Feels her heart beating a bit fast at times.  Has taken antihistamines without improvement and Tylenol for fever as she was nauseated previously and did not want to make things with with NSAIDS.  Current Meds  Medication Sig  . buPROPion (WELLBUTRIN XL) 150 MG 24 hr tablet Take 1 tablet (150 mg total) by mouth daily.  . fluticasone (FLONASE) 50 MCG/ACT nasal spray 2 sprays each nostril daily.  . hydrOXYzine (ATARAX/VISTARIL) 50 MG tablet 1 tab by mouth at bedtime as needed  . sertraline (ZOLOFT) 50 MG tablet 1 tab by mouth daily and increase to 1.5 tabs daily during 9 day of month with PMS   Allergies  Allergen Reactions  . Ondansetron Swelling    Swelling (site not acknowledged)  . Penicillins Diarrhea    Did it involve swelling of the face/tongue/throat, SOB, or low BP? No Did it involve sudden or severe rash/hives, skin peeling, or any reaction on the inside of your mouth or nose? No Did you need to seek medical attention at a hospital or doctor's office? No When did it last happen? Within the past 10 years If all above answers are "NO", may proceed with cephalosporin use.   . Sulfa Antibiotics Rash     Review of Systems    Objective:   Pulse (!) 106   SpO2 99%   Physical Exam  NAD, color is good Congested voice HEENT:  PERRL, EOMI.  Tender over bilateral frontal and maxillary sinuses. Neck:  Supple, No adenopathy Chest:  CTA CV:  RRR without murmur or rub.   Slightly tachy Skin:  No rash.  Rapid flu negative.   COVID for PCR obtained.  Assessment & Plan   Acute sinusitis vs bronchitis:  Azithromycin 500 mg daily for 5 days.  Combination of expectorant with decongestant as needed.  Tylenol or Ibuprofen for chest wall pain

## 2020-04-11 NOTE — Patient Instructions (Signed)
To call if no improvement in 24-48 hours or any worsening.

## 2020-04-11 NOTE — Telephone Encounter (Signed)
Per Dr. Amil Amen patient needs to be seen. May come in and see her in the parking lot.

## 2020-04-12 LAB — SARS-COV-2, NAA 2 DAY TAT

## 2020-04-12 LAB — NOVEL CORONAVIRUS, NAA: SARS-CoV-2, NAA: NOT DETECTED

## 2020-04-18 NOTE — Telephone Encounter (Signed)
Patient presented to clinic to be seen.

## 2020-04-28 ENCOUNTER — Ambulatory Visit: Payer: Self-pay | Admitting: Clinical

## 2020-04-28 DIAGNOSIS — F411 Generalized anxiety disorder: Secondary | ICD-10-CM

## 2020-04-28 DIAGNOSIS — F4310 Post-traumatic stress disorder, unspecified: Secondary | ICD-10-CM

## 2020-05-02 ENCOUNTER — Ambulatory Visit: Payer: Self-pay | Attending: Internal Medicine

## 2020-05-02 DIAGNOSIS — Z23 Encounter for immunization: Secondary | ICD-10-CM

## 2020-05-02 NOTE — Progress Notes (Signed)
   Covid-19 Vaccination Clinic  Name:  Marissa Castillo    MRN: [id number] DOB: 07/19/1990  05/02/2020  [name] was observed post Covid-19 immunization for 15 minutes without incident. She was provided with Vaccine Information Sheet and instruction to access the V-Safe system.   [name] was instructed to call 911 with any severe reactions post vaccine: Marland Kitchen Difficulty breathing  . Swelling of face and throat  . A fast heartbeat  . A bad rash all over body  . Dizziness and weakness   Immunizations Administered    Name Date Dose VIS Date Route   Pfizer COVID-19 Vaccine 05/02/2020  5:24 PM 0.3 mL 03/29/2020 Intramuscular   Manufacturer: [place]   Lot: [id number]   [place]: [id number]

## 2020-05-03 NOTE — Progress Notes (Signed)
   THERAPY PROGRESS NOTE  Session Time: 9:00-10:00am Participation Level: Active Behavioral Response: CasualAlertEuthymic Type of Therapy: Individual Therapy Treatment Goals addressed: Anxiety and Coping towards reducing alcohol intake and management.    Purpose: LCSW met with client for routine individual therapy to work towards treatment goals: Anxiety and Coping towards reducing alcohol intake and management.    Intervention: LCSW met with client for routine individual therapy to work towards treatment goals: Anxiety and Coping towards reducing alcohol intake and management. For this session due to patient being sick and on her honeymoon, this session was focused on check in and updates. LCSW provided reflective listening as patient processed how she has been and updates regarding her alcohol intake.Marland Kitchen LCSW utilized intervention of Supportive Counseling as patient processed her changes in lifestyle that is helping her anxiety/depressive symptoms and alcohol management. LCSW assessed for SI/HI/command psychosis.  Effectiveness: Patient is alert x4 affect. Patient identified she is doing good today. Patient reports although experienced sickness during her honeymoon, it was great vacation. Patient reports she did experience anxiety at her wedding, her bridesmaid observed and were able to help her when she was anxious. During her honeymoon identified ruminating thoughts at night, reports it was negative thinking related. Patient reports feeling overall good with her decision of not having her father/step mother present at her wedding. She shared feeling her mom's spirit present with her during her wedding.   Patient provided update on her alcohol management, reports noticing during her PMS cravings are increasing. Reports currently about 1 glass of wine in the evening with her husband or every other night. Aside of this has not had direct cravings related to wanting to numb any emotions. Patient  reports her motivation towards alcohol management is due to wanting to start a family and does not want to be drinking knowing if possible pregnant.   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   . Intervention was effective as patient was able to reflect on her progress. Progress towards goal is Ongoing. Patient denied active suicidal/homicidal/active psychosis.  Plan Patient offered next appointment for: 05/11/2020  Diagnosis: Generalized Anxiety Disorder and Post Traumatic Stress Disorder and Alcohol Use    Lujean Rave, LCSW 05/03/2020

## 2020-05-10 ENCOUNTER — Ambulatory Visit: Payer: Self-pay | Admitting: Clinical

## 2020-05-10 DIAGNOSIS — F4310 Post-traumatic stress disorder, unspecified: Secondary | ICD-10-CM

## 2020-05-17 NOTE — Progress Notes (Signed)
   THERAPY PROGRESS NOTE  Session Time: 12:00pm-1pm Participation Level: Active Behavioral Response: CasualAlertEuthymic Type of Therapy: Individual Therapy Treatment Goals addressed: Anxiety and Coping towards reducing alcohol intake and management.    Purpose: LCSW met with client for routine individual therapy to work towards treatment goals: Anxiety and Coping towards reducing alcohol intake and management.    Intervention: LCSW met with client for routine individual therapy to work towards treatment goals: Anxiety and Coping towards reducing alcohol intake and management. LCSW provided a brief check in to assess for any significant events and how she is doing today. LCSW utilized intervention of CBT utilizing RP handout of thought stopping and reviewed from PTSD/Substance use worksheet of thought patterns. This session was focused on addictive thinking, as patient processed how she is beginning to challenge the addictive thinking. Patient shared for this session steps she is trying to take to reset and work her on intake. LCSW assessed for SI/HI/command psychosis.  Effectiveness: Patient is alert x4 affect. Patient reports she has been experienced PMS symptoms. Patient report there was a moment she may have been pregnant. Patient reports she and her husband discussed awareness and accountability with the continued drinking and if she were pregnant how will symptoms of withdrawal appear. Patient reports this is helping her work towards her control of use.  Patient reports although the interview for ED wasn't as she expected she was able to meet another person who has showed interest. Patient shared here the connection with horses she has felt she was little. Patient processed how this is feeling like a bond with her mom.   Patient provided update of her alcohol management and amount of use. Patient reports recent use it has not been to numb to cope with feelings. In this session patient  acknowledged her thinking patterns of addictive thinking, patient shared how she is challenged this recently. Patient shared she has began to make behavioral changes such as her daily routine to remind herself she doesn't need alcohol to enjoy. She reports has began to practice tai chi, be fully present with her meditations and pampering herself. Reports summarization of this session is to get back on track and centering herself. Patient acknowledged she is doing things to have a relationship with self.     Intervention was effective as patient was able to reflect, participated and processed in this session. Progress towards goal is Ongoing. Patient denied active suicidal/homicidal/active psychosis.  Plan Patient offered next appointment for:  12/14 9am  Diagnosis: Post Traumatic Stress Disorder and Alcohol Use Disorder     Lujean Rave, LCSW 05/17/2020

## 2020-05-23 ENCOUNTER — Ambulatory Visit (INDEPENDENT_AMBULATORY_CARE_PROVIDER_SITE_OTHER): Payer: Self-pay | Admitting: Clinical

## 2020-05-23 DIAGNOSIS — F4310 Post-traumatic stress disorder, unspecified: Secondary | ICD-10-CM

## 2020-05-26 NOTE — Progress Notes (Signed)
   THERAPY PROGRESS NOTE  Session Time: 9:00-10:00am Participation Level: Active Behavioral Response: CasualAlertAnxious Type of Therapy: Individual Therapy Treatment Goals addressed: Anxiety and Copingtowards reducing alcohol intake and management.   Purpose: LCSW met with client for routine individual therapy to work towards treatment goals: Anxiety and Copingtowards reducing alcohol intake and management.  Intervention: LCSW met with client for routine individual therapy to work towards treatment goals: Anxiety and Copingtowards reducing alcohol intake and management.LCSW provided patient opportunity to check in to assess for any significant events and how she is doing today. LCSW utilized intervention of CBT to work towards processing and reframing her dreams which could be identified as thoughts.  LCSW reviewed DBT skill of Emotion Regulation- Mindfulness of Current Emotions: Letting Go of Emotional Suffering. LCSW recommended patient to utilize emotion regulation in her morning routine with her tai chi by accepting her emotion of anger and releasing it, with goal of peace and relaxation. Patient and LCSW discussed due to her anger towards her step mother it is hurting her emotionally from moving forward. Patient and LCSW discussed skills of opposite action of compassion. Discussed things in her control and not, such as step mothers opinion, however patient can challenge how it all affects her. Patient identified alternatives to challenge her thinking and painful emotions in a healthier way. To finish session LCSW provided patient skill of IMPROVE the moment, a DBT skill to utilize to distress from a painful emotion that increases risk of maladaptive behaviors (alcohol increase intake). To practice skill patient did imagery by using magazines to find words/pictures she enjoys. LCSW assessed for SI/HI/command psychosis.  Effectiveness: Patient is alert x4. Patient reports today she is okay  however has been experiencing dreams that have been troublesome. Patient identified these dreams are her unconscious ruminating of her step mom and father, patient reports feelings of guilt from not inviting them to the feeling of anger of the emotional abuse/manipulation. Patient identified waking up from these dreams trigger craving to drink. Reports she's been able to challenge her cravings but she will wake up annoyed.   Patient does report looking forward to a session with Opal Sidles to help her release these thoughts and feelings aside from therapy/medication. Patient shared as she practices holisitic approaches she is hopeful this will help. Patient agreed to try to channel her anger in her morning routine, and utilize opposite action with goal of emotion of compassion. Patient identified other alternatives to practice is daily walking, and running. Patient reports she wants to work on her short term goal of being active, by doing this she is motivating herself and not let cravings increase risk to drink.    Patient reports she will practice to channel her mom's spirit in her routines to help move through painful emotions and thoughts when she thinks about her stepmom/father. Intervention was effective as patient was able to reflect, active in session, and participated in the skill activity. Progress towards goal is Ongoing. Patient denied active suicidal/homicidal/active psychosis.  Plan Patient offered next appointment for: 12/30 9am  Diagnosis: Generalized Anxiety Disorder and Post Traumatic Stress Disorder, Alcohol Use     Lujean Rave, LCSW 05/26/2020

## 2020-06-08 ENCOUNTER — Other Ambulatory Visit: Payer: Self-pay | Admitting: Clinical

## 2020-06-15 ENCOUNTER — Other Ambulatory Visit: Payer: Self-pay | Admitting: Clinical

## 2020-07-20 ENCOUNTER — Ambulatory Visit: Payer: Self-pay | Admitting: Clinical

## 2020-07-20 ENCOUNTER — Other Ambulatory Visit: Payer: Self-pay

## 2020-07-20 DIAGNOSIS — F4310 Post-traumatic stress disorder, unspecified: Secondary | ICD-10-CM

## 2020-07-20 DIAGNOSIS — F411 Generalized anxiety disorder: Secondary | ICD-10-CM | POA: Insufficient documentation

## 2020-07-20 NOTE — Progress Notes (Signed)
   THERAPY PROGRESS NOTE  Session Time: 10 am - 11 am Participation Level: Active Behavioral Response: Casual and Well GroomedAlertEuthymic Type of Therapy: Individual Therapy Treatment Goals addressed: Anxiety   Purpose: LCSW, along with MSW interns Lauree Chandler and Apolonio Schneiders, met with client for routine individual therapy to work towards treatment goals: establishing self-care routine, maintaining familial boundaries, continuing sobriety, and implementing mindfulness techniques into her daily life.   Intervention: The MSW interns asked the client how she had been doing and assessed for any significant events. Intern Lauree Chandler began by facilitating a mindfulness exercise of a body scan. Intern Apolonio Schneiders then followed the exercise with a series of follow-up questions about how the patient felt during the activity. The interns provided reflective listening to the patient as she processed and shared recent events. The interns also utilized the intervention of Solution Focused, Strength-based and Supportive methods to affirm and validate recent the patient's decision to set a boundary with certain family members to decrease anxiety/depressive symptoms. To maintain her current progress, intern Apolonio Schneiders asked how the patient planned on continuing her progress with her mental and emotional health. LCSW and the interns assessed for SI/HI/command psychosis.  Effectiveness: Patient is alert x4 affect. The patient revealed how she had felt her mind wandering during the body scan at first, but as the exercise continued, she became more present in the session. Patient identified she had been feeling very positive recently, especially since she had quit consuming alcohol. She reported being sober for at least 3 weeks now. She stated how she had many things to look forward to, such as a new job and potentially starting a family with her new husband. Intervention was effective as patient was able to reflect on her growth and  learn more mindfulness techniques that she would be better able to incorporate into her life going forward. Progress towards goal is Ongoing. Patient denied active suicidal/homicidal/active psychosis.  Plan Patient offered next appointment for: 07/27/2020 at 10 am.   Diagnosis: Generalized Anxiety Disorder and Alcohol Use Disorder   MSW Interns  [employer] and Kirstie Peri, LCSW [place] 07/20/2020

## 2020-07-21 NOTE — Addendum Note (Signed)
Addended by: Lujean Rave on: 07/21/2020 10:46 AM   Modules accepted: Level of Service

## 2020-07-27 ENCOUNTER — Ambulatory Visit (INDEPENDENT_AMBULATORY_CARE_PROVIDER_SITE_OTHER): Payer: Self-pay | Admitting: Clinical

## 2020-07-27 DIAGNOSIS — F4310 Post-traumatic stress disorder, unspecified: Secondary | ICD-10-CM

## 2020-07-27 DIAGNOSIS — F411 Generalized anxiety disorder: Secondary | ICD-10-CM

## 2020-07-31 NOTE — Progress Notes (Signed)
THERAPY PROGRESS NOTE  Session Time: 10:00-11:00 Participation Level: Active Behavioral Response: CasualAlertEuthymic Type of Therapy: Individual Therapy Treatment Goals addressed: Anxiety and Coping   Purpose: LCSW met with client for routine individual therapy to work towards treatment goals: coping skills to manage anxiety.   Intervention: LCSW met with client for routine individual therapy to work towards treatment goals: coping skills to manage anxiety. It should be noted for this session [employer] graduate volunteer observed session along with MSW interns Apolonio Schneiders and Lauree Chandler. LCSW and MSW interns provided patient opportunity to check in to assess how she is doing today and if any significant events from previous session. LCSW and MSW interns provided reflective listening as patient shared coping skills and continued recovery process. MSW interns/LCSW praised patient with her ability to utilize and practice her skills.   LCSW utilized intervention of Supportive counseling as patient processed slight worries regarding employment opportunities and journey to become a mom. Patient noted recent visit to tarot reading and receiving validation from the reading to go forward with becoming a mom. LCSW and MSW interns provided patient encouragement with affirmations in her form of confidence and acceptance.  MSW interns facilitated PHQ9/GAD7/AUDIT/DAST-10 screeners to demonstrate to [facility] how this is screened. LCSW and MSW interns utilized this session to discuss progress, current symptoms and develop treatment goals to help going forward with treatment. LCSW assessed for SI/HI/command psychosis.  Effectiveness: Patient is alert x4 affect. Patient reports during a checked in feeling great.  Patient shared continued coping skills of tai chai to practice recentering and mindfulness. Patient reports there are days she doesn't want too however she encourages herself to do it and this has helped her practice  accountability. Patient reports continued recovery as of tomorrow will be 4 weeks. Patient reports she has noted with her recovery she is practicing other lifestyle changes such as eating habits. Patient reports by making these changes in her life she is feeling healthy overall, less anxiety,mindful of present and motivated. Patient shared she hasn't heard anything from potential employment however she is still consistent to reach out by the beginning of the month and continue her search if needed. Patient reports she is feeling confident in her skill set and is hopeful with her career change and if not she is okay with potentially becoming a stay at home. Patient noted during this update practicing patience and acceptance to what is to come. Patient reports what helped is receiving validation from recent tarot cards reading. Patient reported her reader discussed employment and baby journey, shared in regards to employment potential news in the upcoming months and with baby discussed how there was loss in the past and was not ready at that time, however with these new changes being made it appears patient is ready to begin that journey.   Patient participated in the screeners and reflected on her ability of practicing skills and currently on her recovery assisting with her symptoms. Patient participated in the treatment goal planning and identified goals 1) utilizing behavioral and cognitive coping skills to help maintain sobriety by practicing daily physical mindfulness activities. Patient shared she wants to discuss with MD regarding medication management and balance with beginning process to become pregnant.  2) "I will rest in reality with acceptance to work to maintain stable mood by practicing mindfulness and manage change.     Intervention was effective as patient was able to participate, engaged and reflected on her growth and strengths as well in areas she wants to continue  to work towards.  Progress  towards goal is Ongoing. Patient denied active suicidal/homicidal/active psychosis.  Plan Patient offered next appointment for: 2/23 10am  Diagnosis: GAD/PTSD/Alcohol Use Disorder    Lujean Rave, LCSW 07/31/2020

## 2020-08-02 ENCOUNTER — Ambulatory Visit (INDEPENDENT_AMBULATORY_CARE_PROVIDER_SITE_OTHER): Payer: Self-pay | Admitting: Clinical

## 2020-08-02 DIAGNOSIS — F411 Generalized anxiety disorder: Secondary | ICD-10-CM

## 2020-08-08 NOTE — Progress Notes (Signed)
° °  THERAPY PROGRESS NOTE Via [employer]  Session Time: 9am-10am Participation Level: Active Behavioral Response: CasualAlertEuthymic Type of Therapy: Individual Therapy Treatment Goals addressed:  utilizing behavioral and cognitive coping skills to help maintain sobriety by practicing daily physical mindfulness activities. "I will rest in reality with acceptance to work to maintain stable mood by practicing mindfulness and manage change."   Purpose: LCSW met with client for routine individual therapy to work towards treatment goals:  utilizing behavioral and cognitive coping skills to help maintain sobriety by practicing daily physical mindfulness activities. I will rest in reality with acceptance to work to maintain stable mood by practicing mindfulness and manage change.   Intervention: LCSW met with pt for routine individual therapy via [employer]. In this session MSW interns [place] were presented and facilitated session. MSW interns and LCSW provided pt opportunity to check in to assess for any significant events and how she is doing today. During check in pt reported coping skills she has been practicing since last seen.   For this session LCSW and MSW interns facilitated by reading chapter from the [place] 7 step 5 become mindful. The purpose of this chapter was to review variety of mindfulness skills to introduce SOBER breathing, as reviewing each exercise, pt practiced them. LCSW and MSW interns utilized intervention of Motivational Interviewing to continue utilize her motivation for change by identifying skills to help maintain recovery. Patient processed each exercise and shared how she can implement these into her daily life. LCSW assessed for SI/HI/command psychosis.  Effectiveness: Patient is alert x4 affect. Patient reports she has been feeling somewhat stressful as she is caring for friends pets. Mentally she is feeling good as she continues to  practice her routine mindfulness activities of tai chi. Patient reports she has been practicing too by walking her dogs. Patient reports mindful of eating. Patient reports as she has stopped her drinking patterns she is eating more. Patient reports she is adding exercise to her routine and will begin to be conscious to her nutrition. Patient reports consistent with plan regarding employment to reach out in March.  Patient was active and remained engaged in session. Intervention was effective as patient was receptive with MSW feedback to allow to feel her cravings and by using exercises from session can will to mindfully choose her response. Patient shared with exercises from session she can mindfully see the good things that are happening to her. Patient reports she will practice vocalizing her cravings and mindfully be present.  Progress towards goal is Ongoing. Patient denied active suicidal/homicidal/active psychosis.  Plan Patient offered next appointment for: 03/09 10am  Diagnosis: Generalized Anxiety Disorder, Post Traumatic Stress Disorder and Alcohol Use Disorder    Lujean Rave, LCSW 08/08/2020  /

## 2020-08-16 ENCOUNTER — Other Ambulatory Visit: Payer: Self-pay

## 2020-08-16 ENCOUNTER — Ambulatory Visit (INDEPENDENT_AMBULATORY_CARE_PROVIDER_SITE_OTHER): Payer: Self-pay | Admitting: Clinical

## 2020-08-16 DIAGNOSIS — F4310 Post-traumatic stress disorder, unspecified: Secondary | ICD-10-CM

## 2020-08-21 NOTE — Progress Notes (Addendum)
THERAPY PROGRESS NOTE  Session Time: 10-11am Participation Level: Active Behavioral Response: CasualAlert Type of Therapy: Individual Therapy Treatment Goals addressed: utilizing behavioral and cognitive coping skills to help maintain sobriety by practicing daily physical mindfulness activities. "I will rest in reality with acceptance to work to maintain stable mood by practicing mindfulness and manage change."   Purpose: LCSW met with client for routine individual therapy to work towards treatment goals: utilizing behavioral and cognitive coping skills to help maintain sobriety by practicing daily physical mindfulness activities. "I will rest in reality with acceptance to work to maintain stable mood by practicing mindfulness and manage change."  Intervention: LCSW met with patient for routine individual therapy to continue to work towards treatment goals. LCSW provided patient opportunity to check in to assess for any significant events and how she is doing today. LCSW provided reflective listening and validated patients feeling following recent events. LCSW praised patient with accountability of recent events. For this session LCSW facilitated activity of using art within a CBT intervention to express emotions, can act as a distraction, interrupt rumination, release stress and can calm the nervous system ([email or web address]://[email or web address]/art-therapy-exercises-[email or web address]/). LCSW asked patient to paint what the word healing means to her. When finished patient processed the meaning of her painting and shared the relationship it carries for her father. Discussed how painting can be utilized as a Technical sales engineer going forward following the effectiveness of this activity. LCSW assessed for SI/HI/command psychosis.  Effectiveness: Patient is alert x4 affect. Patient reports today she is feeling better than these last few days. Patient reports she relapsed on her drinking by drinking more than expected (see  previous note) at the work dinner. Patient reports she experienced withdrawals so she drunk more to cope with it. Reports on Monday reflecting on the week/weekend she is motivated to work towards longer period of sobriety as she wants to work towards having a family. Patient provided update she did reach out to [employer] regarding potential employment however received no response. Patient reports feelings some frustration due to no response. Patient reports will reach out one more time and continue to search for other opportunities. 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   Patient notes these past few days with her relapse, PR[employer], and suddenly missing her father she was feeling down, notes although she drank with her medications, she noticed off days. Reports her husband validated her feelings missing her dad. Patient reports she couldn't really describe her feelings to him so doing this activity gave her an idea how to express it the next time. Patient shared the drawing that came to mind with the word healing is of [place]. Patient reports she has been following the news and is just distraught of current events. Patient shared as she was painting she began to remember this is something she used to do with her mom. Reports her mom showed her techniques. Patient reports she doesn't recall why she stopped but reports she enjoys being creative. Patient processed she often wants to explore her spiritual faith and doing this activity gave her an idea of an alternative to practice maintaining her relationship with her mom. Patient reports she is looking forward to her yoga retreat this weekend. Patient reports she will use this as a mini vacation and reset.   Intervention was effective as patient was able to practice to express  her feeling and thoughts by using paint. Patient was able to  process her painting in session and was receptive to the idea to use this as a coping skills going forward. Progress towards goal is Ongoing. Patient denied active suicidal/homicidal/active psychosis.  Plan Patient offered next appointment for: to continue to learn coping skills to manage cravings.  3.24 10am  Diagnosis: alcohol use disorder, PTSD    Lujean Rave, LCSW 08/21/2020

## 2020-08-31 ENCOUNTER — Other Ambulatory Visit: Payer: Self-pay

## 2020-08-31 ENCOUNTER — Ambulatory Visit (INDEPENDENT_AMBULATORY_CARE_PROVIDER_SITE_OTHER): Payer: Self-pay | Admitting: Clinical

## 2020-08-31 DIAGNOSIS — F4310 Post-traumatic stress disorder, unspecified: Secondary | ICD-10-CM

## 2020-08-31 DIAGNOSIS — F411 Generalized anxiety disorder: Secondary | ICD-10-CM

## 2020-09-06 NOTE — Progress Notes (Signed)
THERAPY PROGRESS NOTE  Session Time: 60 minutes 10:00-11:00 am Participation Level: Active Behavioral Response: CasualAlertEuthymic Type of Therapy: Individual Therapy Treatment Goals addressed: Communication: with her husband and Coping   Purpose: LCSW and MSW Intern met with client for routine individual therapy to work towards treatment goals of communicating more effectively with her Husband as well as continuing to develop healthy coping strategies. Client came to the session wanting to discuss some frustrations she is having with her husband in regards to some of his recent behaviors. 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  Intervention: LCSW and MSW Intern assessed for any significant events and assessed how they are doing today. MSW Intern utilized intervention of Solution Focused therapy to decrease stress regarding the client's frustrations with her husband and with the job search. MSW Intern provided reflective and encouraging statements as the client shared about her recent stress related to finding a job. MSW intern provided the client with a solution-focused approach by discussing with the client that she has many options of jobs to choose from and that this is a problem she can overcome. MSW Intern provided constructive feedback to the client when she expressed frustrations about her husband not being physically active anymore. MSW Intern helped the client come up with a solution to her frustration by suggesting that she have a conversation with her husband and tell him how she feels about him not being physically active anymore. MSW Intern expressed to the client that it is important for her to talk to her husband using "I statements" to ensure that she is expressing how she feels and not placing blame. MSW Intern utilized handout regarding mindfulness called "life  story journal" that would encourage the client to examine her past, present, and future. MSW Intern provided this handout to encourage the client to see the progress she has made overtime in therapy and how she continues to learn new skills that help her to cope with life stressors. MSW Intern provided a strengths-based approach by bringing forward the client's strengths of her determination and courage she has had throughout the therapeutic process. LCSW and MSW Intern assessed for SI/HI/command psychosis.  Effectiveness: Client is alert x4 affect. Client identified she is experiencing stress about finding a job that suits her needs. Client responded to the MSW Intern's problem-solving approach by sharing some ideas that she had about how she can de-stress and begin moving towards other jobs. Client stated that she felt that she got closure about not being offered the job and that she knows she is more than qualified. Client expressed gratitude for the MSW Intern's help with finding solutions to the problem at hand. Client also participated in the session by sharing that she is frustrated with her husband for stopping physical activity. Client was responsive to MSW Intern's ideas about ways she can talk to her husband and express her feelings using "I statements". Client stated that she felt that talking to him would be helpful and that letting him know how she feels might encourage him to start exercising again.  Intervention was effective as patient was able to reflect on her feelings regarding why she is upset with her husband and how she would like to be better at communicating her feelings to him. Client stated that she was excited to work on the "life story" journal because it will help her  to recognize her strengths and progress she has made in therapy. This intervention was effective because the client was able to go through the stages of looking at her past, present, and future and identify where she has  strengths and where she has room to grow. Client took home the worksheet to write more in depth about the past, present, and future she experienced to bring back to the next session and reflect upon. Client stated they were appreciative of the support that the MSW Intern and LCSW have had for her throughout the therapy process and that she loves the encouraging statements that MSW Intern provided throughout the session. Progress towards goal is Ongoing. Patient denied active suicidal/homicidal/active psychosis.  Plan Patient offered next appointment for: April 7th, 2022 at 10:00 am   Diagnosis: Generalized Anxiety Disorder and Post Traumatic Stress Disorder    Kirstie Peri, MSW Intern  09/06/2020

## 2020-09-11 NOTE — Addendum Note (Signed)
Addended by: Lujean Rave on: 09/11/2020 04:00 PM   Modules accepted: Level of Service

## 2020-09-12 ENCOUNTER — Ambulatory Visit: Payer: Self-pay | Admitting: Internal Medicine

## 2020-09-14 ENCOUNTER — Other Ambulatory Visit: Payer: Self-pay | Admitting: Clinical

## 2020-09-21 ENCOUNTER — Ambulatory Visit (INDEPENDENT_AMBULATORY_CARE_PROVIDER_SITE_OTHER): Payer: Self-pay | Admitting: Clinical

## 2020-09-21 DIAGNOSIS — F411 Generalized anxiety disorder: Secondary | ICD-10-CM

## 2020-09-25 ENCOUNTER — Ambulatory Visit: Payer: Self-pay

## 2020-09-27 NOTE — Addendum Note (Signed)
Addended by: Lujean Rave on: 09/27/2020 05:13 PM   Modules accepted: Level of Service

## 2020-09-27 NOTE — Progress Notes (Signed)
   THERAPY PROGRESS NOTE  Session Time: 10:00-11:00 am 60 minutes Participation Level: Active Behavioral Response: CasualAlertEuthymic Type of Therapy: Individual Therapy Treatment Goals addressed: Anxiety and Coping   Purpose: LCSW and MSW interns met with client for routine individual therapy to work towards treatment goals of coping skills. MSW Interns wrapped up treatment with client and provided a closure activity.  Intervention: LCSW and MSW interns assessed for any significant events and assessed how they are doing today. MSW Interns [place] utilized intervention of Strength-based intervention as client shared about her recent stability. MSW Interns provided reflective statements and affirmations for the client as she shared news that she is pregnant and has accepted a new job. As client shared her nervousness about all of the new changes in her life, MSW Interns reminded client of the coping skills and mindfulness skills that she has and that she knows how to use in times of need. MSW interns utilized "goodbye letter" handout to focus on helping the client to gain closure on the end of the helping relationship. MSW interns used a strengths-based approach to provide client with words of affirmation about her steady progress in treatment in addition to recognizing the client's strengths that she had demonstrated throughout the treatment process.   Effectiveness: Client is alert x4 affect. Client identified she has been feeling stable the past few weeks. Client expressed happiness through sharing her news that she is pregnant and that she is accepting a new job. Client shared that she is feeling nervous that there are many changes happening in her life. Intervention was effective because the client was able to acknowledge that she has many coping skills to help her work through the anxiety she is experiencing about change. Client participated in session sharing that she is sad that the  MSW Interns are leaving, but glad that there is a chance to have some closure. Intervention was effective as patient was able to reflect on her relationship with MSW Interns and the progress she has made towards reaching stability. Client was thankful for the words of affirmation and she was able to recognize that she has made significant progress in her time working with MSW Interns. Progress towards goal is Ongoing. Client denied active suicidal/homicidal/active psychosis.  Plan Patient offered next appointment for: May 12th at 10:00 am  Diagnosis: Generalized Anxiety Disorder    Kirstie Peri 09/27/2020

## 2020-10-19 ENCOUNTER — Other Ambulatory Visit: Payer: Self-pay | Admitting: Clinical

## 2020-10-27 ENCOUNTER — Other Ambulatory Visit: Payer: Self-pay

## 2020-10-27 ENCOUNTER — Ambulatory Visit: Payer: Self-pay | Admitting: Clinical

## 2020-10-27 DIAGNOSIS — F4310 Post-traumatic stress disorder, unspecified: Secondary | ICD-10-CM

## 2020-10-31 NOTE — Progress Notes (Signed)
   THERAPY PROGRESS NOTE  Session Time: 10-11am Participation Level: Active Behavioral Response: CasualAlertEuthymic Type of Therapy: Individual Therapy Treatment Goals addressed: coping skills to manage anxiety symptoms    Purpose: LCSW met with client for routine individual therapy to work towards treatment goals: coping skills to manage anxiety. The purpose of this session was termination as patient identified meeting goals and will be moving to another city.   Intervention: LCSW asked permission to patient for volunteer [employer] to be present. For this session due to not being able to see patient for about a month, LCSW utilized this session as a check in to assess how she has been doing and processing significant events. LCSW provided reflective listening and congratulated patient on her accomplishments and what is next for her. LCSW validated her worries regarding her baby. LCSW encouraged patient to reflect her energy on the positivity surrounding her instead of any resentment towards her step mother and father. LCSW discussed being mindful of her current experiences and it's meaning. To finish session and review treatment goals, LCSW asked patient what she could reflect on her experience in therapy and how she will continue to work towards self healing going forward. LCSW reviewed with patient what LCSW has learned from patient too. LCSW encouraged patient when stabilization to continue her treatment in her new city. LCSW assessed for SI/HI/command psychosis.  Effectiveness: Patient is alert x4 affect. 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 This is the position she had applied at the beginning of the year. Patient shared due to this position she will be moving to a new city. Patient also reports will be selling her home. Patient reports although these changes are coming fast she is excited to see the outcomes. Patient  reports she is [redacted] weeks pregnant and hasn't shared with all as she is anxious with first term possibility of miscarriage. Patient accepted mindful perspective to experience each day. Patient reports currently pregnant she has not drunk any alcohol, she has thought about the alcohol but not engage with the cravings. Patient reports she is grateful her talents, and skills have been recognized. Patient expressed gratitude with LCSW with being able to be accepted and have opportunity to begin her healing process. Patient reports with her role she hopes to continue to inspire and support others as others have done the same with her. Patient accepted LCSWs feedback in session as well. Patient acknowledged reaching treatment goals and being able to cope with her anxiety. Patient reports once stabilized in the city and currently mood being stable she hopes to continue treatment possibly to once a month in the new city.    Intervention was effective as patient was able to reflect and processed these changes. Progress towards goal is Achieved. Patient denied active suicidal/homicidal/active psychosis.  Plan Patient offered next appointment for: Encouraged to follow up therapy in her new city once stabilized.   Diagnosis: Post Traumatic Stress Disorder; Alcohol use Disorder early remission (due to pregnancy)    Lujean Rave, LCSW 10/31/2020

## 2020-11-01 ENCOUNTER — Ambulatory Visit: Payer: Self-pay | Admitting: Internal Medicine

## 2021-05-12 IMAGING — CT CT ANGIO CHEST
2 of 6 series · 17 of 36 positions shown · IV contrast (omnipaque)
Comparison: None.

CLINICAL DATA: Abdominal pain, tachypnea, shortness of breath,
history of pancreatitis

EXAM:
CT ANGIOGRAPHY CHEST WITH CONTRAST
TECHNIQUE: Multidetector CT imaging of the chest was performed using the
standard protocol during bolus administration of intravenous
contrast. Multiplanar CT image reconstructions and MIPs were
obtained to evaluate the vascular anatomy.
CONTRAST:  80mL OMNIPAQUE IOHEXOL 350 MG/ML SOLN

[Series 7: pe thins · axial · 0.84mm/px · z∈[+988,+1253]mm · 16 of 420 slices shown]
[im 21/420  lung]
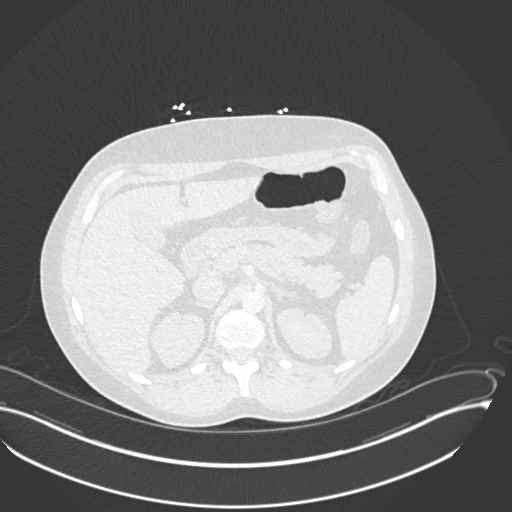
[im 42/420  mediastinal]
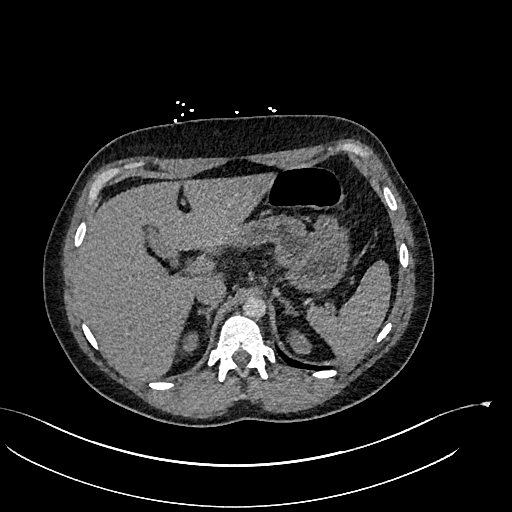
[im 63/420  lung]
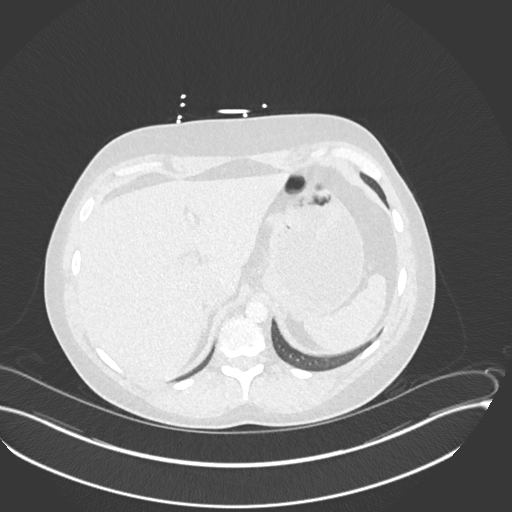
[im 105/420  mediastinal]
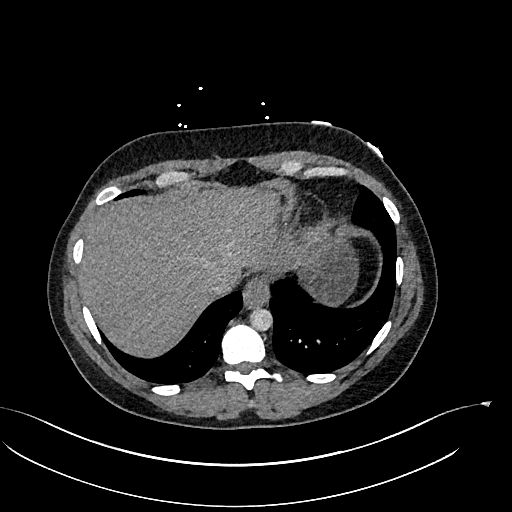
[im 126/420  lung]
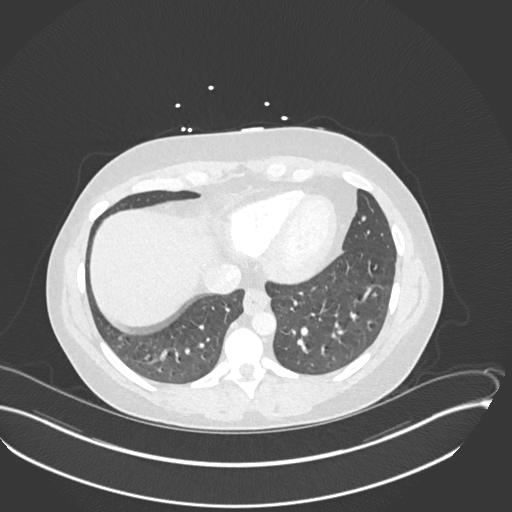
[im 147/420  mediastinal]
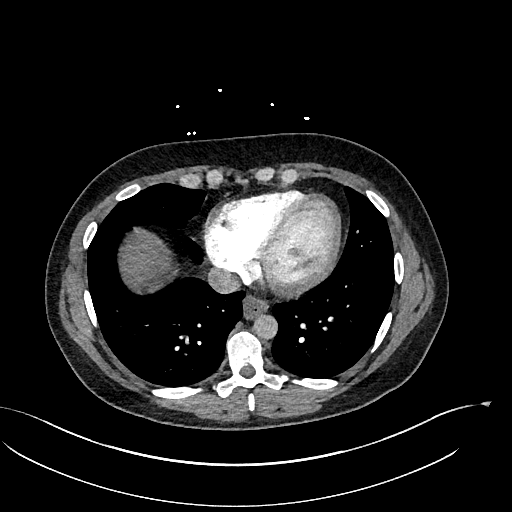
[im 168/420  lung]
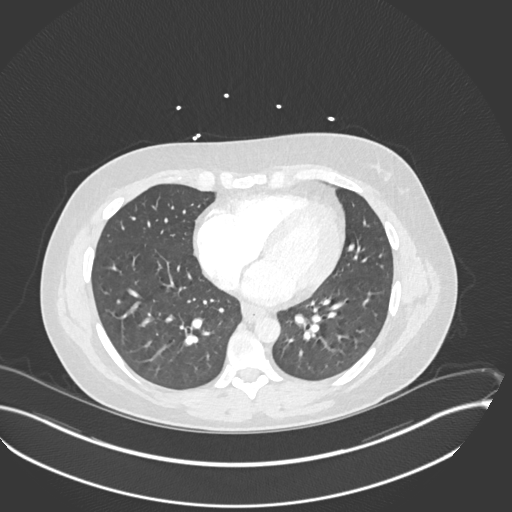
[im 189/420  mediastinal]
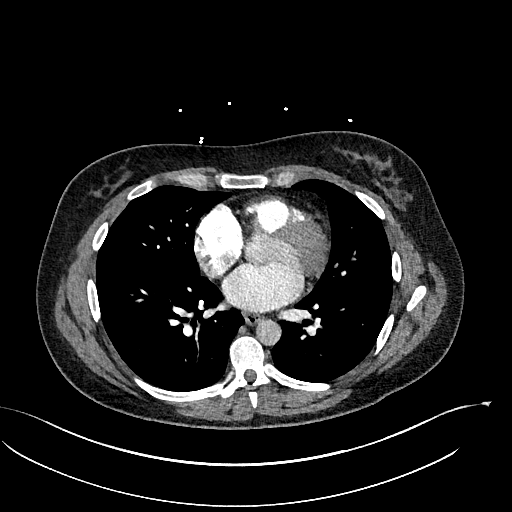
[im 231/420  lung]
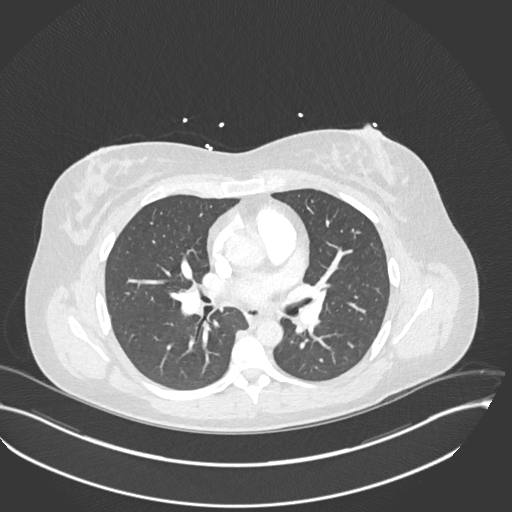
[im 252/420  mediastinal]
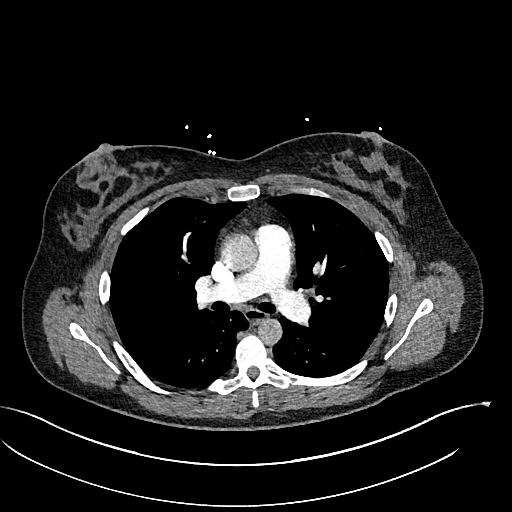
[im 273/420  lung]
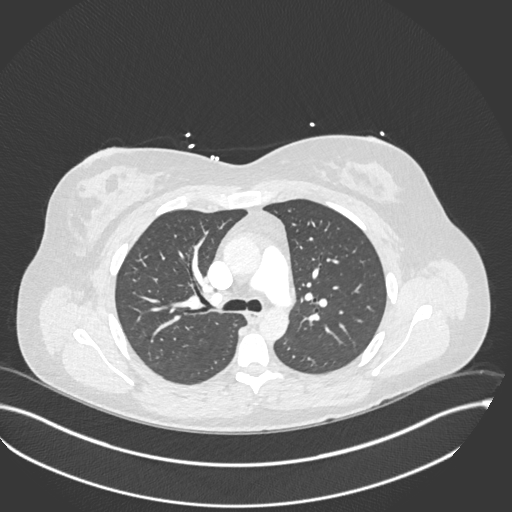
[im 294/420  mediastinal]
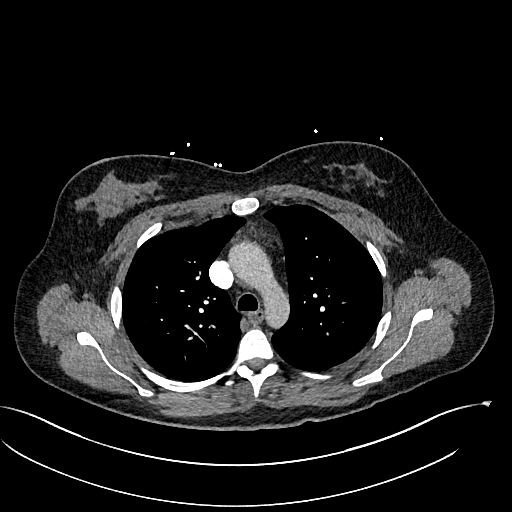
[im 315/420  lung]
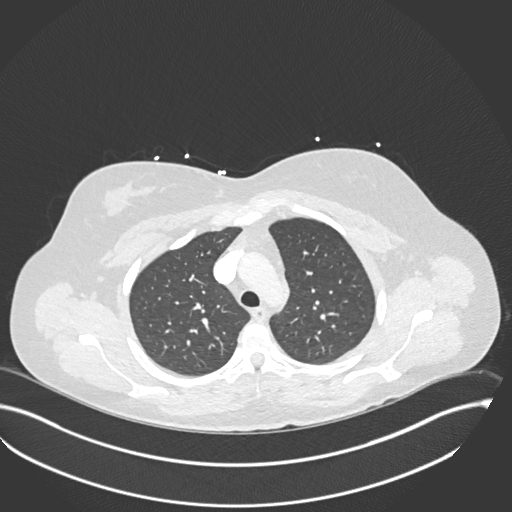
[im 357/420  mediastinal]
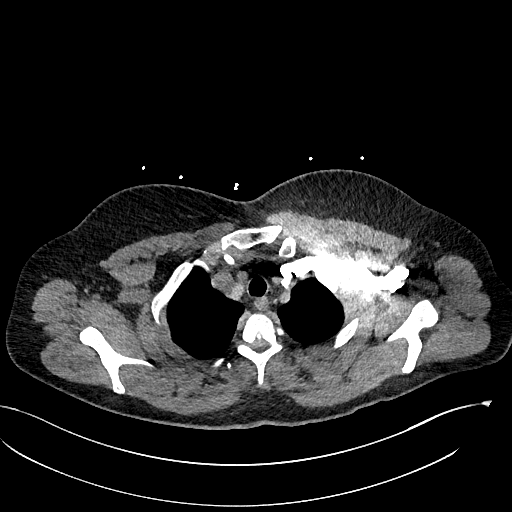
[im 378/420  lung]
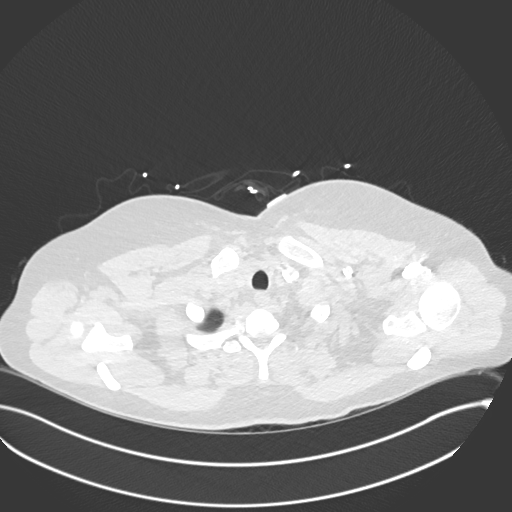
[im 399/420  mediastinal]
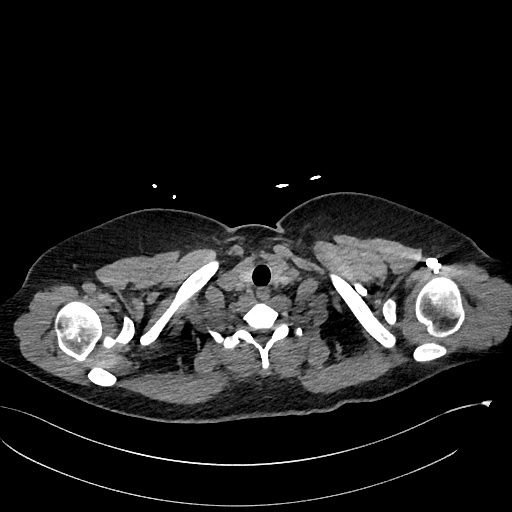

[Series 8: pe 2mm cor · coronal · 0.61mm/px · 1 of 144 slices shown]
[im 72/144  mediastinal]
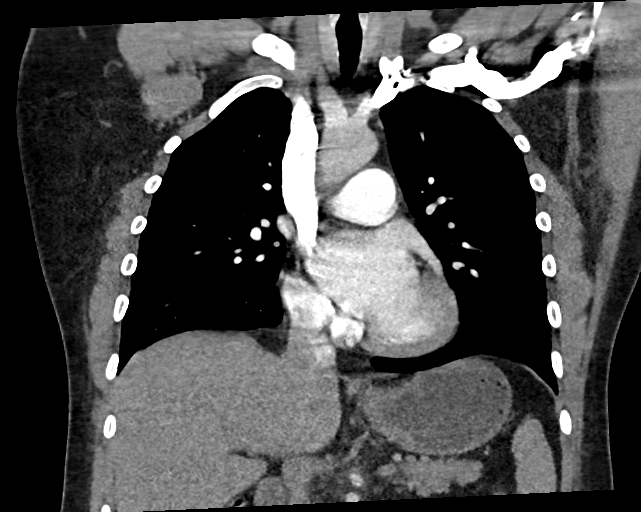

[17 of 36 positions shown; findings below may reference images not displayed]

FINDINGS: Cardiovascular: There is a optimal opacification of the pulmonary
arteries. There is no central,segmental, or subsegmental filling
defects within the pulmonary arteries. The heart is normal in size.
No pericardial effusion or thickening. No evidence right heart
strain. There is normal three-vessel brachiocephalic anatomy without
proximal stenosis. The thoracic aorta is normal in appearance.

Mediastinum/Nodes: No hilar, mediastinal, or axillary adenopathy.
Thyroid gland, trachea, and esophagus demonstrate no significant
findings.

Lungs/Pleura: The lungs are clear. No pleural effusion or
pneumothorax. No airspace consolidation.

Upper Abdomen: No acute abnormalities present in the visualized
portions of the upper abdomen.

Musculoskeletal: No chest wall abnormality. No acute or significant
osseous findings.

Review of the MIP images confirms the above findings.

Abdomen/pelvis:

Lower chest: The visualized heart size within normal limits. No
pericardial fluid/thickening.

No hiatal hernia.

The visualized portions of the lungs are clear.

Hepatobiliary: The liver is normal in density without focal
abnormality.The main portal vein is patent. No evidence of calcified
gallstones, gallbladder wall thickening or biliary dilatation.

Pancreas: Unremarkable. No pancreatic ductal dilatation or
surrounding inflammatory changes.

Spleen: Normal in size without focal abnormality.

Adrenals/Urinary Tract: Both adrenal glands appear normal. The
kidneys and collecting system appear normal without evidence of
urinary tract calculus or hydronephrosis. Bladder is unremarkable.

Stomach/Bowel: The stomach and small bowel are normal in appearance.
Within the right upper quadrant at the hepatic flexure there is
question of mild wall thickening with edema and surrounding mild fat
stranding changes. No pericolonic free fluid or free air is seen. A
moderate amount of right colonic stool is seen.

Vascular/Lymphatic: There are no enlarged mesenteric,
retroperitoneal, or pelvic lymph nodes. No significant vascular
findings are present.

Reproductive: The uterus and adnexa are unremarkable.

Other: No evidence of abdominal wall mass or hernia.

Musculoskeletal: No acute or significant osseous findings.
IMPRESSION: 1.  No central, segmental, or subsegmental pulmonary embolism.
2. No acute intrathoracic pathology.
3. mild wall thickening involving the hepatic flexure with minimal
surrounding fat stranding changes which could be due to mild
colitis, infectious or inflammatory.

## 2021-07-20 ENCOUNTER — Telehealth: Payer: Self-pay | Admitting: Gastroenterology

## 2021-07-20 NOTE — Telephone Encounter (Signed)
Inbound call from [place] requesting path result from patients Colon and Endo procedure done 04/14//21. Please advise.  Fax number:  [phone]

## 2021-08-06 IMAGING — US US ABDOMEN LIMITED
1 series · 14 of 25 positions shown · non-contrast
Comparison: CT abdomen pelvis dated 05/13/2019.

CLINICAL DATA: 28-year-old female with right upper quadrant
abdominal pain and vomiting.

EXAM:
ULTRASOUND ABDOMEN LIMITED RIGHT UPPER QUADRANT

[Series 1: us abdomen limited · 14 of 52 slices shown]
[im 1/52]
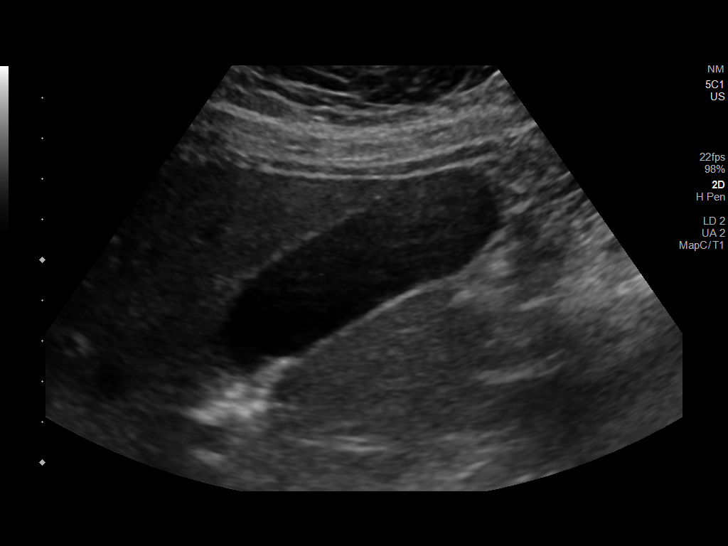
[im 5/52]
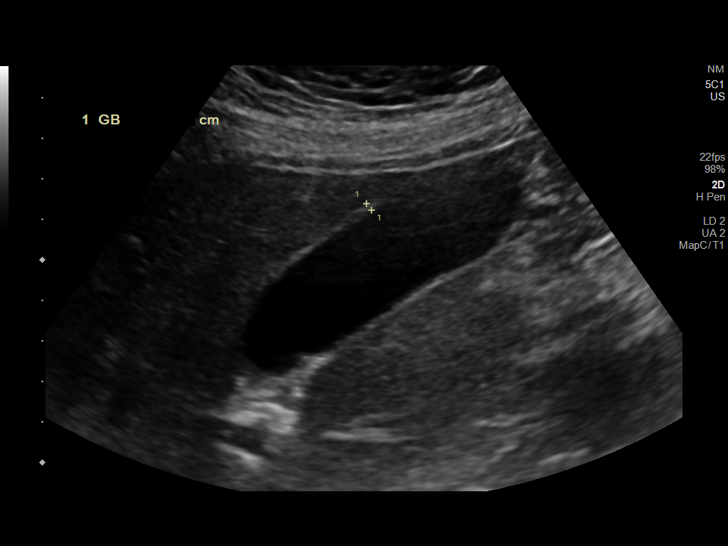
[im 9/52]
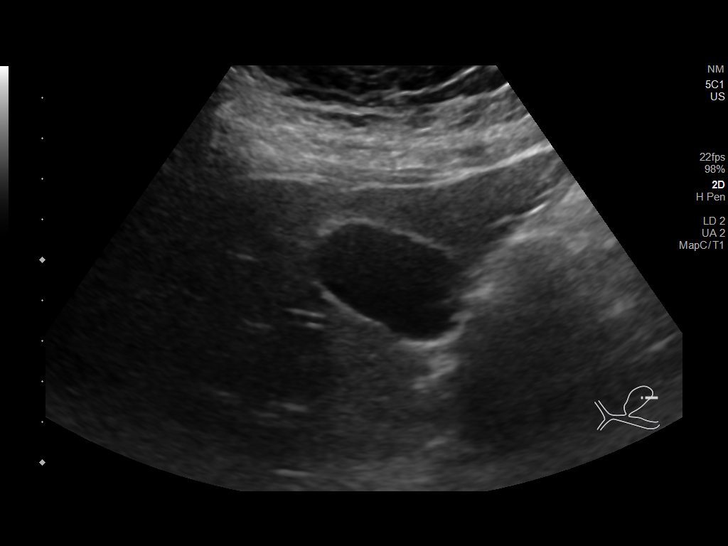
[im 13/52]
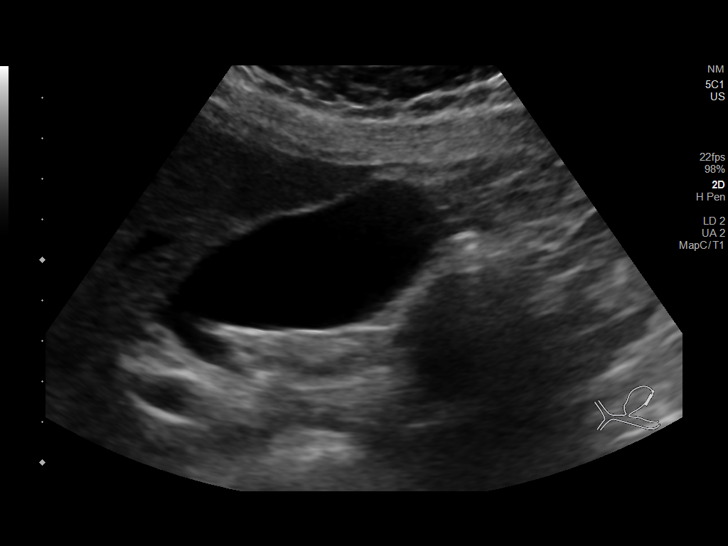
[im 18/52]
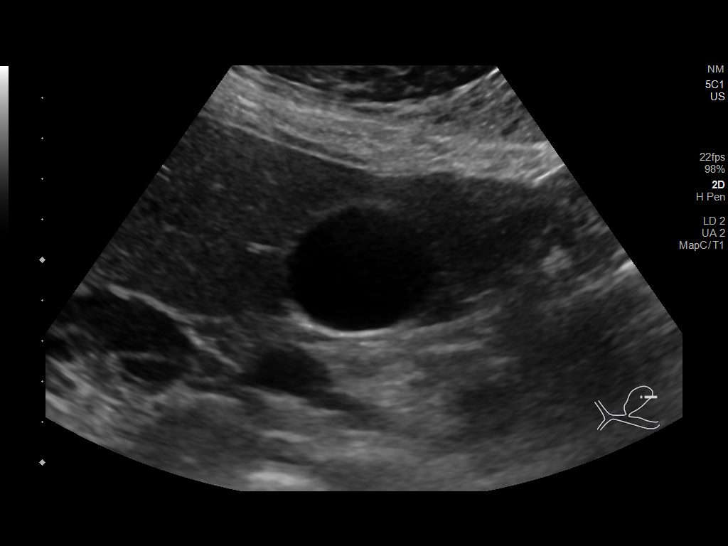
[im 20/52]
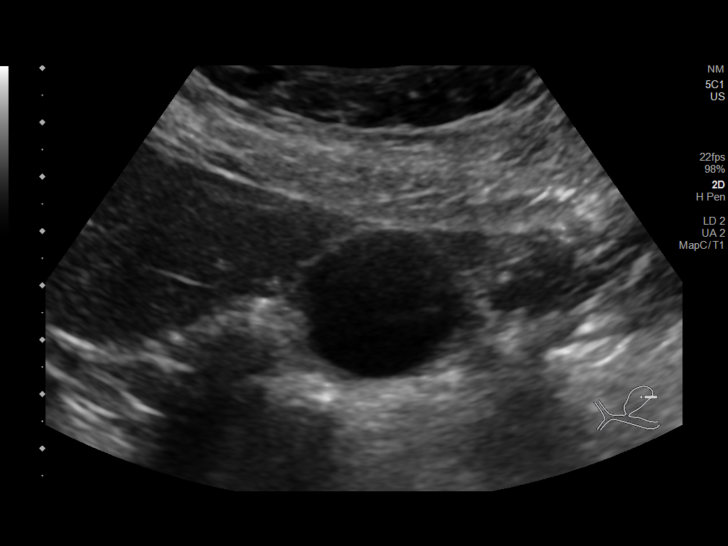
[im 24/52]
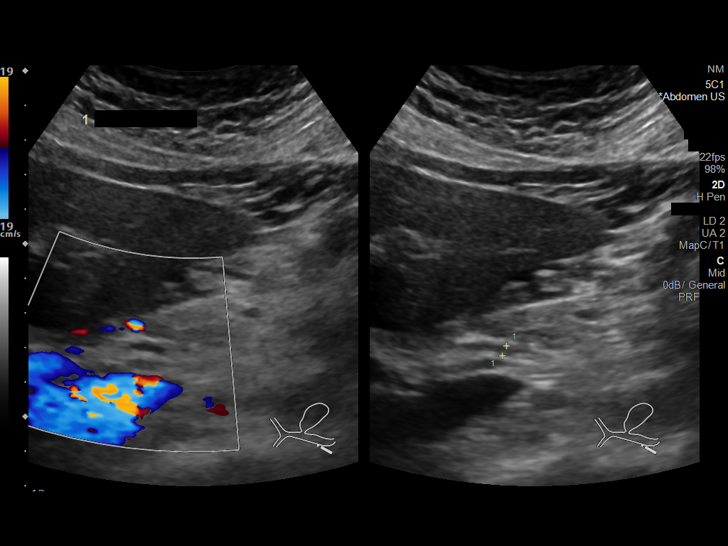
[im 28/52]
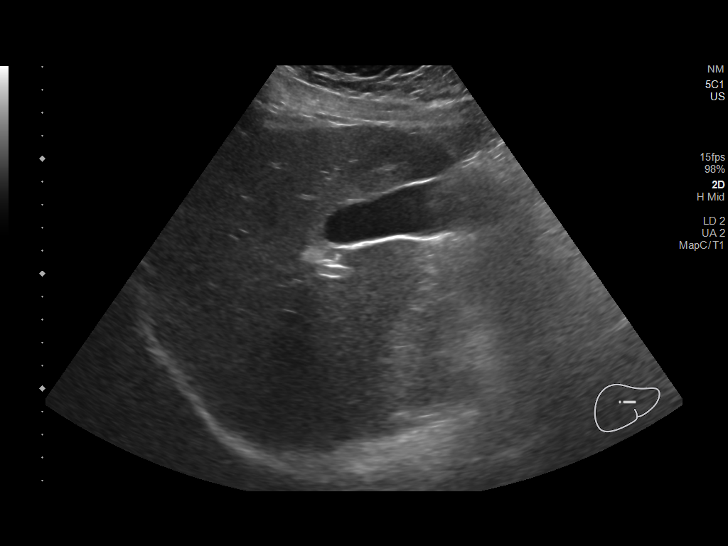
[im 32/52]
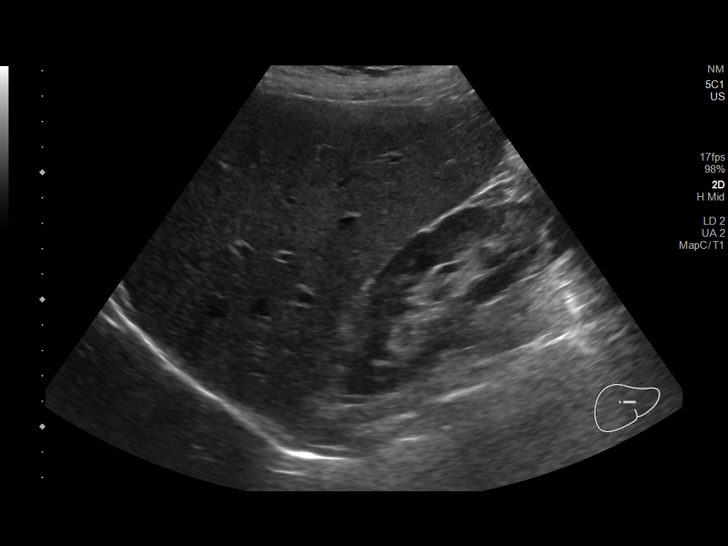
[im 35/52]
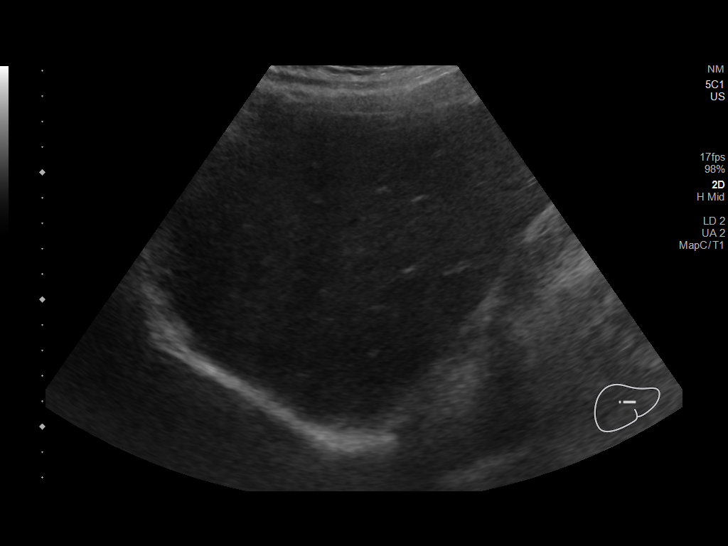
[im 39/52]
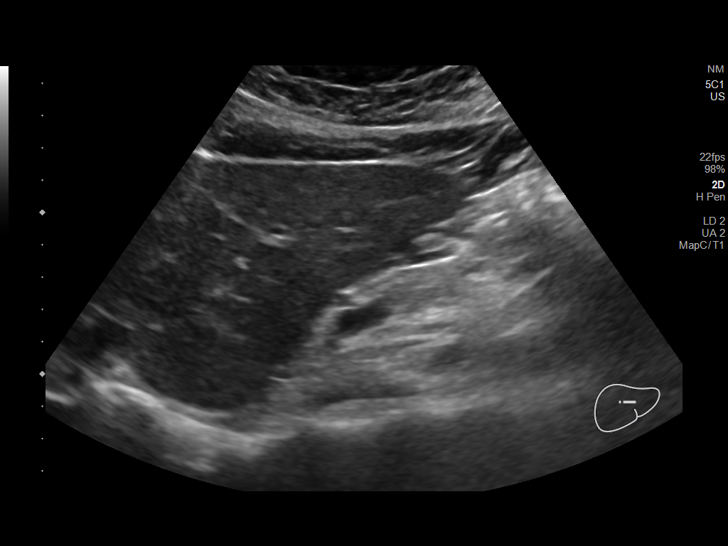
[im 43/52]
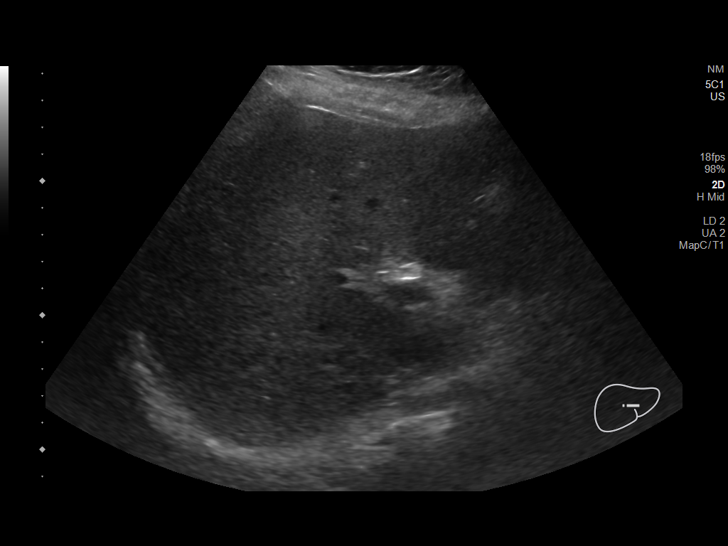
[im 47/52]
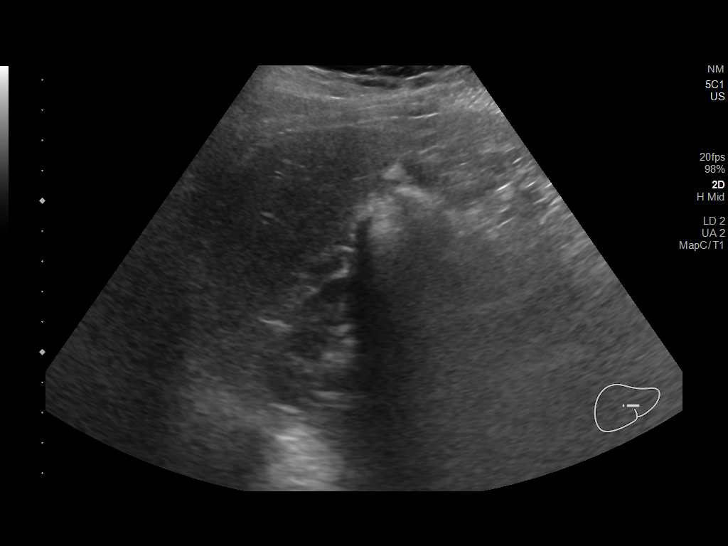
[im 52/52]
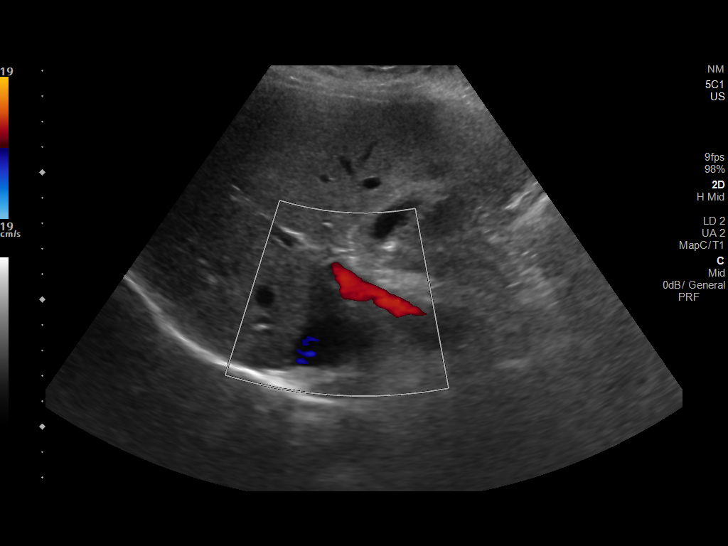

[14 of 25 positions shown; findings below may reference images not displayed]

FINDINGS: Gallbladder:

No gallstones or wall thickening visualized. No sonographic Murphy
sign noted by sonographer.

Common bile duct:

Diameter: 4 mm.

Liver:

The liver is unremarkable. Portal vein is patent on color Doppler
imaging with normal direction of blood flow towards the liver.

Other: None.
IMPRESSION: Unremarkable right upper quadrant ultrasound.

## 2021-11-14 IMAGING — CR DG CHEST 2V
2 series · 2 of 2 positions shown · non-contrast
Comparison: No priors.

CLINICAL DATA: 28-year-old female with history of chest pain.

EXAM:
CHEST - 2 VIEW

[chest pa]
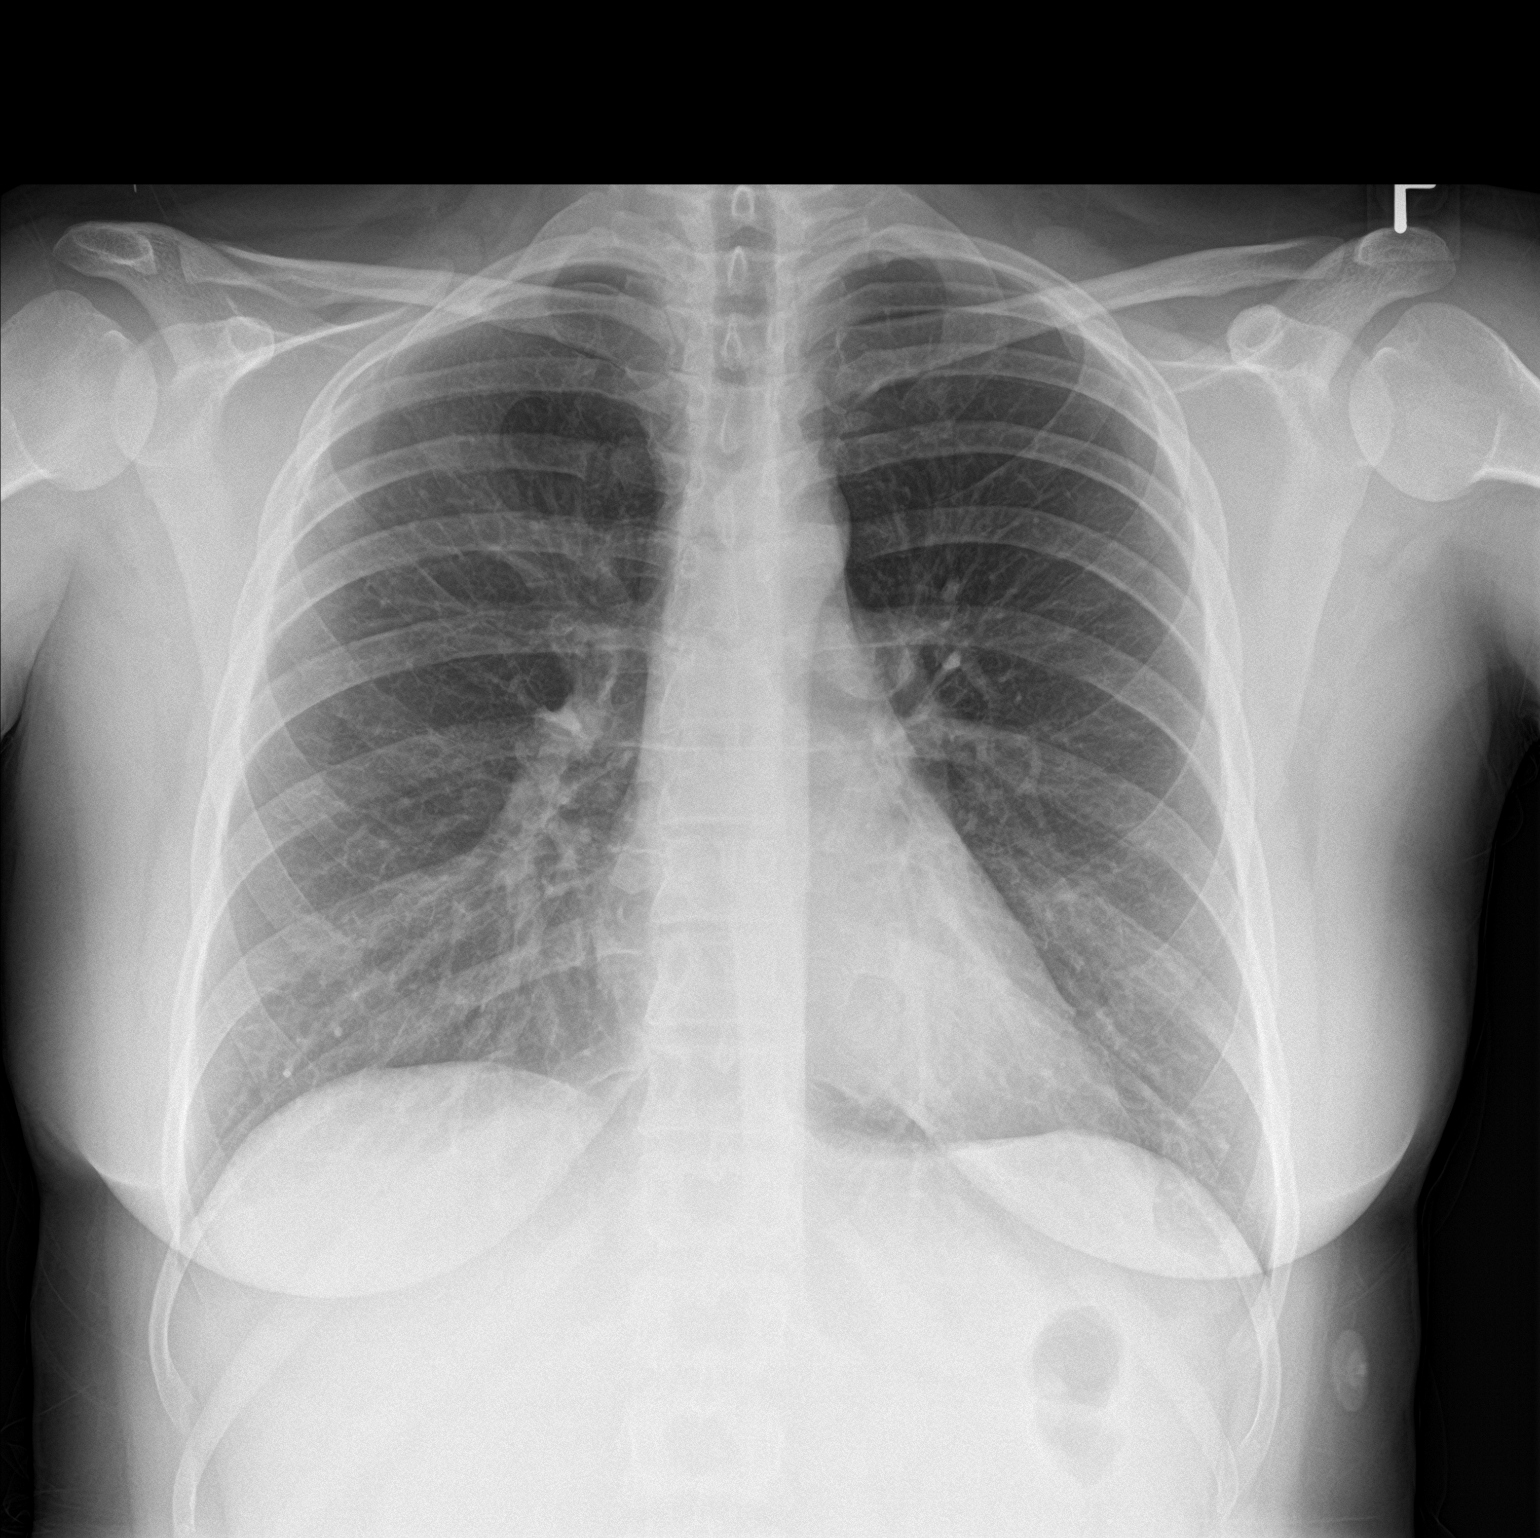

[chest lat]
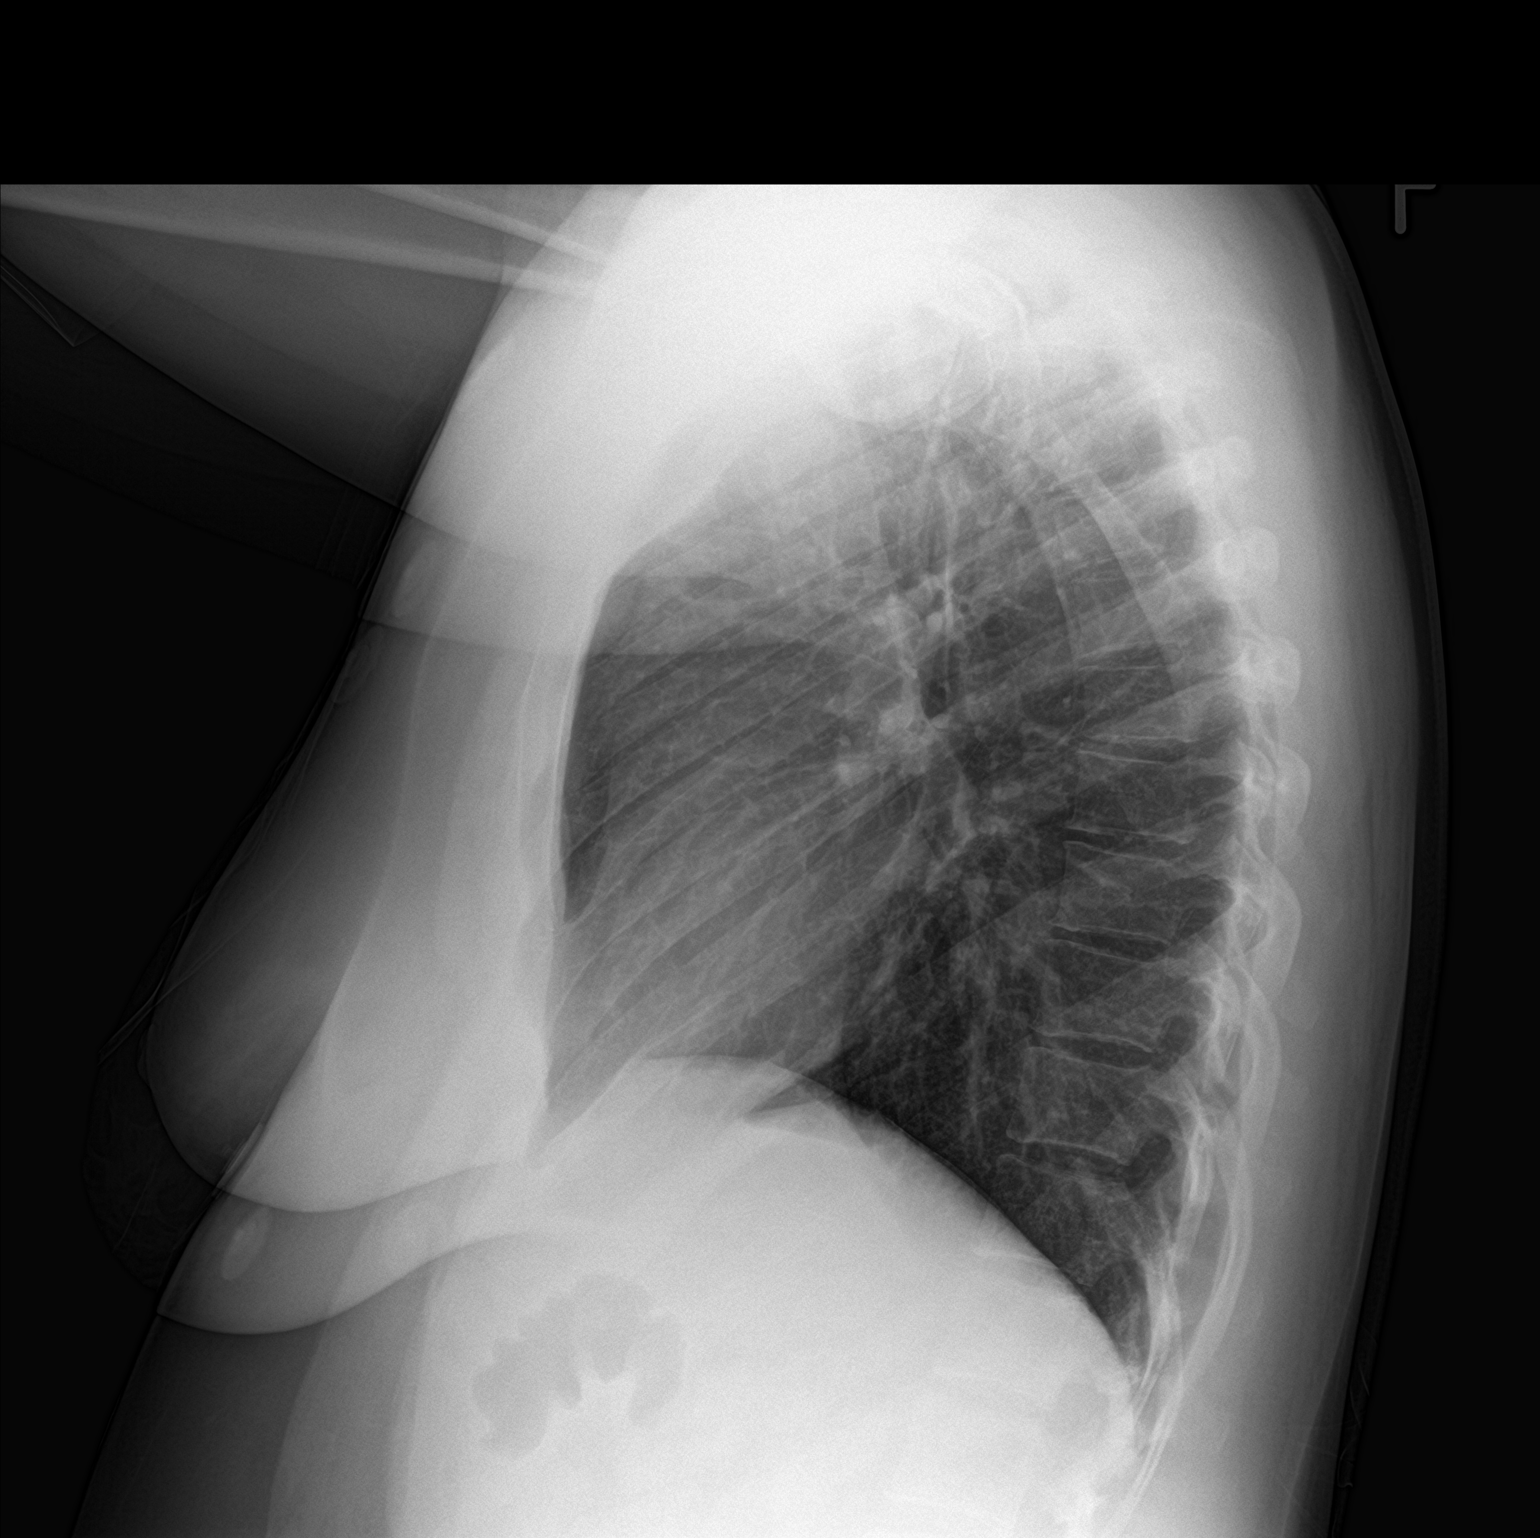

[2 of 2 positions shown; findings below may reference images not displayed]

FINDINGS: Lung volumes are normal. No consolidative airspace disease. No
pleural effusions. No pneumothorax. No pulmonary nodule or mass
noted. Pulmonary vasculature and the cardiomediastinal silhouette
are within normal limits.
IMPRESSION: No radiographic evidence of acute cardiopulmonary disease.
